# Patient Record
Sex: Male | Born: 1940 | Race: White | Hispanic: No | Marital: Married | Smoking: Never smoker
Health system: Southern US, Community
[De-identification: ages and names within clinical notes are randomized; demographics above are authoritative.]

## PROBLEM LIST (undated history)

## (undated) DIAGNOSIS — N401 Enlarged prostate with lower urinary tract symptoms: Secondary | ICD-10-CM

## (undated) DIAGNOSIS — E119 Type 2 diabetes mellitus without complications: Secondary | ICD-10-CM

## (undated) DIAGNOSIS — D696 Thrombocytopenia, unspecified: Secondary | ICD-10-CM

## (undated) DIAGNOSIS — N400 Enlarged prostate without lower urinary tract symptoms: Secondary | ICD-10-CM

## (undated) DIAGNOSIS — Z8619 Personal history of other infectious and parasitic diseases: Secondary | ICD-10-CM

## (undated) DIAGNOSIS — N3281 Overactive bladder: Secondary | ICD-10-CM

## (undated) DIAGNOSIS — E538 Deficiency of other specified B group vitamins: Secondary | ICD-10-CM

## (undated) DIAGNOSIS — I10 Essential (primary) hypertension: Secondary | ICD-10-CM

## (undated) DIAGNOSIS — M1990 Unspecified osteoarthritis, unspecified site: Secondary | ICD-10-CM

## (undated) DIAGNOSIS — N471 Phimosis: Secondary | ICD-10-CM

## (undated) DIAGNOSIS — F419 Anxiety disorder, unspecified: Secondary | ICD-10-CM

## (undated) DIAGNOSIS — E785 Hyperlipidemia, unspecified: Secondary | ICD-10-CM

## (undated) HISTORY — PX: COLONOSCOPY: SHX174

## (undated) HISTORY — PX: TONSILLECTOMY: SUR1361

## (undated) HISTORY — PX: APPENDECTOMY: SHX54

---

## 1999-07-26 ENCOUNTER — Ambulatory Visit (HOSPITAL_COMMUNITY): Admission: RE | Admit: 1999-07-26 | Discharge: 1999-07-26 | Payer: Self-pay | Admitting: *Deleted

## 1999-07-26 ENCOUNTER — Encounter (INDEPENDENT_AMBULATORY_CARE_PROVIDER_SITE_OTHER): Payer: Self-pay | Admitting: Specialist

## 2000-01-10 ENCOUNTER — Encounter: Payer: Self-pay | Admitting: Cardiology

## 2000-01-10 ENCOUNTER — Ambulatory Visit (HOSPITAL_COMMUNITY): Admission: RE | Admit: 2000-01-10 | Discharge: 2000-01-10 | Payer: Self-pay | Admitting: Cardiology

## 2004-02-26 ENCOUNTER — Ambulatory Visit (HOSPITAL_COMMUNITY): Admission: RE | Admit: 2004-02-26 | Discharge: 2004-02-26 | Payer: Self-pay | Admitting: *Deleted

## 2004-02-26 ENCOUNTER — Encounter (INDEPENDENT_AMBULATORY_CARE_PROVIDER_SITE_OTHER): Payer: Self-pay | Admitting: Specialist

## 2007-07-12 ENCOUNTER — Ambulatory Visit (HOSPITAL_COMMUNITY): Admission: RE | Admit: 2007-07-12 | Discharge: 2007-07-12 | Payer: Self-pay | Admitting: *Deleted

## 2008-02-14 HISTORY — PX: COLONOSCOPY: SHX174

## 2009-04-08 ENCOUNTER — Encounter: Admission: RE | Admit: 2009-04-08 | Discharge: 2009-07-07 | Payer: Self-pay | Admitting: Internal Medicine

## 2010-06-28 NOTE — Op Note (Signed)
NAME:  Kyle Molina, Kyle Molina NO.:  [id number]   MEDICAL RECORD NO.:  [id number]          PATIENT TYPE:  AMB   LOCATION:  ENDO                         FACILITY:  [facility]   PHYSICIAN:  Georgiana Spinner, M.D.    DATE OF BIRTH:  [birth year]   DATE OF PROCEDURE:  07/12/2007  DATE OF DISCHARGE:                               OPERATIVE REPORT   PROCEDURE:  Colonoscopy.   INDICATIONS:  Colon polyps.   ANESTHESIA:  Fentanyl 75 mcg, Versed 7.5 mg, Phenergan 12.5 mg.   PROCEDURE IN DETAIL:  With the patient mildly sedated in the lateral  decubitus position a rectal examination was performed which was  unremarkable.  Subsequently the Pentax videoscopic colonoscope was  inserted in the rectum and passed under direct vision to the cecum  identified by ileocecal valve and appendiceal orifice both of which were  photographed.  From this point the colonoscope was slowly withdrawn  taking circumferential views of colonic mucosa stopping in the rectum  which appeared normal on direct, showed hemorrhoids on retroflexed view.  The endoscope was straightened and withdrawn.  The patient's vital  signs, pulse oximeter remained stable.  The patient tolerated the  procedure well without apparent complications.   FINDINGS:  Diverticulosis of the sigmoid colon, internal hemorrhoids,  otherwise an unremarkable examination.   PLAN:  Repeat examination in 5 years.           ______________________________  Georgiana Spinner, M.D.     GMO/MEDQ  D:  07/12/2007  T:  07/12/2007  Job:  [id number]

## 2010-07-01 NOTE — Procedures (Signed)
[facility]  Patient:    Kyle Molina, Kyle Molina                     MRN: [id number] Proc. Date: 07/26/99 Adm. Date:  [id number] Disc. Date: [id number] Attending:  Sabino Gasser                           Procedure Report  PROCEDURE:  Colonoscopy.  INDICATIONS:  Hemoccult positivity.  ANESTHESIA:  Demerol 80 mg, Versed 10 mg, droperidol 1.25 mg were given intravenously in divided dose.  DESCRIPTION OF PROCEDURE:  With patient mildly sedated in the left lateral decubitus position, the Olympus videoscopic colonoscope was inserted in the rectum after a normal rectal exam and passed under direct vision to the cecum. Cecum identified by the ileocecal valve and appendiceal orifice, both of which were photographed.  We entered into the small bowel through the ileocecal valve.  This too appeared normal and was photographed.  From this point the colonoscope was slowly withdrawn, taking circumferential views of the entire colonic mucosa, stopping at 50 cm from the anal verge, at which point a small polyp was seen, photographed and biopsied and removed using hot biopsy forceps technique, set at 20/20 blended current.  The endoscope was then further withdrawn, taking circumferential views of the remaining colonic mucosa, stopping to photograph a diverticulum seen along the way in the sigmoid colon. The rectum was viewed in retroflexed view.  Internal hemorrhoids were seen. The scope was straightened and withdrawn.  The patients vital signs and pulse oximetry remained stable.  The patient tolerated the procedure well without apparent complications.  FINDINGS:  Small polyp at 50 cm from the anal verge, await biopsy report.  The patient will call me for results.  Rare diverticulum in the sigmoid colon, otherwise unremarkable colonoscopic examination, including normal terminal ileum.  PLAN:  The patient will call me for results of biopsy and follow up with me as an  outpatient. DD:  07/26/99 TD:  07/29/99 Job: [id number]

## 2016-02-14 DIAGNOSIS — Z862 Personal history of diseases of the blood and blood-forming organs and certain disorders involving the immune mechanism: Secondary | ICD-10-CM

## 2016-02-14 HISTORY — DX: Personal history of diseases of the blood and blood-forming organs and certain disorders involving the immune mechanism: Z86.2

## 2016-10-14 DIAGNOSIS — Z872 Personal history of diseases of the skin and subcutaneous tissue: Secondary | ICD-10-CM

## 2016-10-14 HISTORY — DX: Personal history of diseases of the skin and subcutaneous tissue: Z87.2

## 2016-10-24 ENCOUNTER — Emergency Department (HOSPITAL_COMMUNITY): Payer: Medicare HMO

## 2016-10-24 ENCOUNTER — Inpatient Hospital Stay (HOSPITAL_COMMUNITY)
Admission: EM | Admit: 2016-10-24 | Discharge: 2016-10-28 | DRG: 872 | Disposition: A | Discharge status: Home / Self Care | Payer: Medicare HMO | Attending: Internal Medicine | Admitting: Internal Medicine

## 2016-10-24 ENCOUNTER — Encounter (HOSPITAL_COMMUNITY): Payer: Self-pay | Admitting: *Deleted

## 2016-10-24 DIAGNOSIS — L03114 Cellulitis of left upper limb: Secondary | ICD-10-CM | POA: Diagnosis present

## 2016-10-24 DIAGNOSIS — D696 Thrombocytopenia, unspecified: Secondary | ICD-10-CM | POA: Diagnosis present

## 2016-10-24 DIAGNOSIS — Z79899 Other long term (current) drug therapy: Secondary | ICD-10-CM

## 2016-10-24 DIAGNOSIS — A419 Sepsis, unspecified organism: Secondary | ICD-10-CM | POA: Diagnosis not present

## 2016-10-24 DIAGNOSIS — Z7982 Long term (current) use of aspirin: Secondary | ICD-10-CM | POA: Diagnosis not present

## 2016-10-24 DIAGNOSIS — D72819 Decreased white blood cell count, unspecified: Secondary | ICD-10-CM | POA: Diagnosis present

## 2016-10-24 DIAGNOSIS — I10 Essential (primary) hypertension: Secondary | ICD-10-CM | POA: Diagnosis present

## 2016-10-24 DIAGNOSIS — E876 Hypokalemia: Secondary | ICD-10-CM | POA: Diagnosis present

## 2016-10-24 DIAGNOSIS — E119 Type 2 diabetes mellitus without complications: Secondary | ICD-10-CM

## 2016-10-24 DIAGNOSIS — D649 Anemia, unspecified: Secondary | ICD-10-CM | POA: Diagnosis present

## 2016-10-24 DIAGNOSIS — Z882 Allergy status to sulfonamides status: Secondary | ICD-10-CM | POA: Diagnosis not present

## 2016-10-24 DIAGNOSIS — W5501XA Bitten by cat, initial encounter: Secondary | ICD-10-CM

## 2016-10-24 DIAGNOSIS — L03119 Cellulitis of unspecified part of limb: Secondary | ICD-10-CM | POA: Diagnosis not present

## 2016-10-24 DIAGNOSIS — E785 Hyperlipidemia, unspecified: Secondary | ICD-10-CM | POA: Diagnosis present

## 2016-10-24 DIAGNOSIS — E872 Acidosis: Secondary | ICD-10-CM | POA: Diagnosis present

## 2016-10-24 DIAGNOSIS — R000 Tachycardia, unspecified: Secondary | ICD-10-CM | POA: Diagnosis present

## 2016-10-24 DIAGNOSIS — L0390 Cellulitis, unspecified: Secondary | ICD-10-CM | POA: Diagnosis present

## 2016-10-24 HISTORY — DX: Hyperlipidemia, unspecified: E78.5

## 2016-10-24 HISTORY — DX: Essential (primary) hypertension: I10

## 2016-10-24 HISTORY — DX: Type 2 diabetes mellitus without complications: E11.9

## 2016-10-24 LAB — CBC WITH DIFFERENTIAL/PLATELET
Basophils Absolute: 0 10*3/uL (ref 0.0–0.1)
Basophils Relative: 0 %
EOS ABS: 0 10*3/uL (ref 0.0–0.7)
Eosinophils Relative: 0 %
HEMATOCRIT: 37.8 % — AB (ref 39.0–52.0)
HEMOGLOBIN: 13.4 g/dL (ref 13.0–17.0)
LYMPHS ABS: 0.6 10*3/uL — AB (ref 0.7–4.0)
LYMPHS PCT: 5 %
MCH: 30.9 pg (ref 26.0–34.0)
MCHC: 35.4 g/dL (ref 30.0–36.0)
MCV: 87.1 fL (ref 78.0–100.0)
MONOS PCT: 12 %
Monocytes Absolute: 1.3 10*3/uL — ABNORMAL HIGH (ref 0.1–1.0)
NEUTROS PCT: 83 %
Neutro Abs: 9.4 10*3/uL — ABNORMAL HIGH (ref 1.7–7.7)
Platelets: 89 10*3/uL — ABNORMAL LOW (ref 150–400)
RBC: 4.34 MIL/uL (ref 4.22–5.81)
RDW: 12.6 % (ref 11.5–15.5)
WBC: 11.3 10*3/uL — ABNORMAL HIGH (ref 4.0–10.5)

## 2016-10-24 LAB — COMPREHENSIVE METABOLIC PANEL
ALT: 19 U/L (ref 17–63)
AST: 28 U/L (ref 15–41)
Albumin: 3.8 g/dL (ref 3.5–5.0)
Alkaline Phosphatase: 52 U/L (ref 38–126)
Anion gap: 13 (ref 5–15)
BUN: 12 mg/dL (ref 6–20)
CHLORIDE: 103 mmol/L (ref 101–111)
CO2: 22 mmol/L (ref 22–32)
CREATININE: 1.17 mg/dL (ref 0.61–1.24)
Calcium: 8.9 mg/dL (ref 8.9–10.3)
GFR calc non Af Amer: 58 mL/min — ABNORMAL LOW (ref >60)
Glucose, Bld: 165 mg/dL — ABNORMAL HIGH (ref 65–99)
POTASSIUM: 3.2 mmol/L — AB (ref 3.5–5.1)
SODIUM: 138 mmol/L (ref 135–145)
Total Bilirubin: 3 mg/dL — ABNORMAL HIGH (ref 0.3–1.2)
Total Protein: 6.3 g/dL — ABNORMAL LOW (ref 6.5–8.1)

## 2016-10-24 LAB — MAGNESIUM: MAGNESIUM: 1.3 mg/dL — AB (ref 1.7–2.4)

## 2016-10-24 LAB — LACTIC ACID, PLASMA: Lactic Acid, Venous: 3.8 mmol/L (ref 0.5–1.9)

## 2016-10-24 LAB — I-STAT CG4 LACTIC ACID, ED: LACTIC ACID, VENOUS: 2.85 mmol/L — AB (ref 0.5–1.9)

## 2016-10-24 MED ORDER — SODIUM CHLORIDE 0.9 % IV SOLN
3.0000 g | Freq: Once | INTRAVENOUS | Status: AC
  Administered 2016-10-24: 3 g via INTRAVENOUS
  Filled 2016-10-24: qty 3

## 2016-10-24 MED ORDER — ACETAMINOPHEN 325 MG PO TABS
650.0000 mg | ORAL_TABLET | Freq: Four times a day (QID) | ORAL | Status: DC | PRN
  Administered 2016-10-25 – 2016-10-26 (×2): 650 mg via ORAL
  Filled 2016-10-24 (×3): qty 2

## 2016-10-24 MED ORDER — SODIUM CHLORIDE 0.9 % IV BOLUS (SEPSIS)
1000.0000 mL | Freq: Once | INTRAVENOUS | Status: AC
  Administered 2016-10-24: 1000 mL via INTRAVENOUS

## 2016-10-24 MED ORDER — ASPIRIN 81 MG PO CHEW
81.0000 mg | CHEWABLE_TABLET | Freq: Every day | ORAL | Status: DC
  Administered 2016-10-25 – 2016-10-28 (×4): 81 mg via ORAL
  Filled 2016-10-24 (×4): qty 1

## 2016-10-24 MED ORDER — IBUPROFEN 400 MG PO TABS
600.0000 mg | ORAL_TABLET | Freq: Once | ORAL | Status: AC
  Administered 2016-10-24: 600 mg via ORAL
  Filled 2016-10-24: qty 2

## 2016-10-24 MED ORDER — ACETAMINOPHEN 650 MG RE SUPP: 650.0000 mg | Freq: Four times a day (QID) | RECTAL | Status: DC | PRN

## 2016-10-24 MED ORDER — DOXAZOSIN MESYLATE 2 MG PO TABS
4.0000 mg | ORAL_TABLET | Freq: Every evening | ORAL | Status: DC
  Administered 2016-10-24 – 2016-10-27 (×4): 4 mg via ORAL
  Filled 2016-10-24 (×4): qty 2

## 2016-10-24 MED ORDER — NIACIN 500 MG PO TABS
2000.0000 mg | ORAL_TABLET | Freq: Every day | ORAL | Status: DC
Start: 2016-10-24 — End: 2016-10-28
  Administered 2016-10-24 – 2016-10-27 (×3): 2000 mg via ORAL
  Filled 2016-10-24 (×6): qty 4

## 2016-10-24 MED ORDER — ATORVASTATIN CALCIUM 20 MG PO TABS
20.0000 mg | ORAL_TABLET | Freq: Every day | ORAL | Status: DC
  Administered 2016-10-24 – 2016-10-27 (×4): 20 mg via ORAL
  Filled 2016-10-24 (×4): qty 1

## 2016-10-24 MED ORDER — SODIUM CHLORIDE 0.9 % IV SOLN
INTRAVENOUS | Status: DC
  Administered 2016-10-24: via INTRAVENOUS

## 2016-10-24 MED ORDER — SODIUM CHLORIDE 0.9 % IV SOLN
3.0000 g | Freq: Three times a day (TID) | INTRAVENOUS | Status: DC
  Administered 2016-10-25 – 2016-10-26 (×5): 3 g via INTRAVENOUS
  Filled 2016-10-24 (×8): qty 3

## 2016-10-24 MED ORDER — METOPROLOL SUCCINATE ER 50 MG PO TB24
50.0000 mg | ORAL_TABLET | Freq: Two times a day (BID) | ORAL | Status: DC
  Administered 2016-10-24 – 2016-10-28 (×7): 50 mg via ORAL
  Filled 2016-10-24 (×8): qty 1

## 2016-10-24 MED ORDER — POTASSIUM CHLORIDE CRYS ER 20 MEQ PO TBCR
20.0000 meq | EXTENDED_RELEASE_TABLET | Freq: Two times a day (BID) | ORAL | Status: AC
  Administered 2016-10-24 – 2016-10-25 (×2): 20 meq via ORAL
  Filled 2016-10-24 (×2): qty 1

## 2016-10-24 MED ORDER — TRAMADOL HCL 50 MG PO TABS
50.0000 mg | ORAL_TABLET | Freq: Two times a day (BID) | ORAL | Status: DC
  Administered 2016-10-24 – 2016-10-28 (×8): 50 mg via ORAL
  Filled 2016-10-24 (×8): qty 1

## 2016-10-24 MED ORDER — DIAZEPAM 5 MG PO TABS
2.5000 mg | ORAL_TABLET | Freq: Two times a day (BID) | ORAL | Status: DC
  Administered 2016-10-24 – 2016-10-28 (×7): 2.5 mg via ORAL
  Filled 2016-10-24 (×8): qty 1

## 2016-10-24 NOTE — Progress Notes (Signed)
CRITICAL VALUE ALERT  Critical Value:  Lactic acid 3.8  Date & Time Notied:  10/24/16 2300  Provider Notified: Onalee Huaavid, MD  Orders Received/Actions taken: previously addressed

## 2016-10-24 NOTE — H&P (Signed)
History and Physical    Kyle Molina [id number] DOB: April 05, 1940 DOA: 10/24/2016  PCP: Richmond CampbellKaplan, Kristen W., PA-C  Patient coming from:  home  Chief Complaint:   Redness to hand  HPI: Kyle Molina is a 76 y.o. male with medical history significant of HTN, DM, HLD comes in with redness streaking up his left hand after being bitten by his cat 3 days ago.  He was bitten on his left thumb, the area is now red and slightly painful.  He denies any pain with moving his wrist or digits, the pain and discomfort is all superficial.  Redness started streaking up his arm so he came to the ED.  He has not been on oral abx.  There is no drainage anywhere.  Overall pretty healthy.  Pt referred for admission for cellulitis of his hand.  Dr Romeo Appleharrison has been called and [facility]  In route with EMS his temp was 101.   Review of Systems: As per HPI otherwise 10 point review of systems negative.   Past Medical History:  Diagnosis Date  . Diabetes mellitus without complication (HCC)   . Hyperlipidemia   . Hypertension     History reviewed. No pertinent surgical history.   reports that he has never smoked. He has never used smokeless tobacco. He reports that he does not drink alcohol or use drugs.  Allergies  Allergen Reactions  . Sulfa Antibiotics Itching    History reviewed. No pertinent family history.  No premature CAD  Prior to Admission medications   Medication Sig Start Date End Date Taking? Authorizing Provider  aspirin 81 MG tablet Take 81 mg by mouth daily.   Yes [provider]  atorvastatin (LIPITOR) 20 MG tablet Take 20 mg by mouth at bedtime.   Yes [provider]  diazepam (VALIUM) 5 MG tablet Take 2.5 mg by mouth 2 (two) times daily.   Yes [provider]  doxazosin (CARDURA) 4 MG tablet Take 4 mg by mouth every evening.   Yes [provider]  metFORMIN (GLUCOPHAGE) 500 MG tablet Take 1,000 mg by mouth daily  with supper.   Yes [provider]  metoprolol succinate (TOPROL-XL) 100 MG 24 hr tablet Take 50 mg by mouth 2 (two) times daily. Take with or immediately following a meal.   Yes [provider]  niacin 500 MG tablet Take 2,000 mg by mouth at bedtime.   Yes [provider]  traMADol (ULTRAM) 50 MG tablet Take 50 mg by mouth 2 (two) times daily.   Yes [provider]    Physical Exam: Vitals:   10/24/16 1721 10/24/16 1730 10/24/16 1800 10/24/16 2000  BP: 118/60 (!) 122/55 (!) 109/46 (!) 118/51  Pulse: 97 98  94  Resp: 13 10 10  (!) 9  Temp: 99.8 F (37.7 C)     TempSrc: Oral     SpO2: 94% 98% 98% 98%      Constitutional: NAD, calm, comfortable Vitals:   10/24/16 1721 10/24/16 1730 10/24/16 1800 10/24/16 2000  BP: 118/60 (!) 122/55 (!) 109/46 (!) 118/51  Pulse: 97 98  94  Resp: 13 10 10  (!) 9  Temp: 99.8 F (37.7 C)     TempSrc: Oral     SpO2: 94% 98% 98% 98%   Eyes: PERRL, lids and conjunctivae normal ENMT: Mucous membranes are moist. Posterior pharynx clear of any exudate or lesions.Normal dentition.  Neck: normal, supple, no masses, no thyromegaly Respiratory:  clear to auscultation bilaterally, no wheezing, no crackles. Normal respiratory effort. No accessory muscle use.  Cardiovascular: Regular rate and rhythm, no murmurs / rubs / gallops. No extremity edema. 2+ pedal pulses. No carotid bruits.  Abdomen: no tenderness, no masses palpated. No hepatosplenomegaly. Bowel sounds positive.  Musculoskeletal: no clubbing / cyanosis. No joint deformity upper and lower extremities. Good ROM, no contractures. Normal muscle tone.  Skin: no rashes, lesions, ulcers. No induration. X left thumb with erythema with streak up to elbow no swelling, no evidence of deeper infection.  Has full range of motion of left wrist and all digits without any pain Neurologic: CN 2-12 grossly intact. Sensation intact, DTR normal. Strength 5/5 in all 4.  Psychiatric: Normal  judgment and insight. Alert and oriented x 3. Normal mood.    Labs on Admission: I have personally reviewed following labs and imaging studies  CBC:  Recent Labs Lab 10/24/16 1733  WBC 11.3*  NEUTROABS 9.4*  HGB 13.4  HCT 37.8*  MCV 87.1  PLT 89*   Basic Metabolic Panel:  Recent Labs Lab 10/24/16 1733  NA 138  K 3.2*  CL 103  CO2 22  GLUCOSE 165*  BUN 12  CREATININE 1.17  CALCIUM 8.9   GFR: CrCl cannot be calculated (Unknown ideal weight.). Liver Function Tests:  Recent Labs Lab 10/24/16 1733  AST 28  ALT 19  ALKPHOS 52  BILITOT 3.0*  PROT 6.3*  ALBUMIN 3.8    Radiological Exams on Admission: Dg Hand Complete Left  Result Date: 10/24/2016 CLINICAL DATA:  Initial evaluation for acute swelling with redness about the thumb and index finger. EXAM: LEFT HAND - COMPLETE 3+ VIEW COMPARISON:  None available. FINDINGS: No acute fracture dislocation. Diffuse soft tissue swelling present about the thumb and index finger. No radiographic findings to suggest erosive or inflammatory arthropathy. 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 Osteopenia. IMPRESSION: 1. No acute fracture or dislocation. 2. Mild diffuse soft tissue swelling about the thumb and index finger. 3. 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 No radiographic evidence for erosive or inflammatory arthropathy. Electronically Signed   By: Rise Mu M.D.   On: 10/24/2016 18:47    Assessment/Plan 76 yo male with left hand cellulitis from cat bite  Principal Problem:   Cellulitis- mild infection.  Should improve in the next day or so with iv unasyn.  dont think there is deeper infection per my exam, however dr Romeo Apple will see patient in the am for formal hand surgery evaluation.  Lactic 2.8, no  fluids given yet in ED, will bolus one liter now and place on fluids overnight and serial lactate until normalizes.  Pt not septic.  Active Problems:   Hypertension- noted, cont home meds   Hyperlipidemia- cont home statin   Diabetes mellitus without complication (HCC)- holding metformin while in house esp in the setting of mild lactic acidosis    DVT prophylaxis: scds, ambulate  Code Status:  full Family Communication:  none Disposition Plan:  Per day team Consults called:  Dr Romeo Apple Admission status:  admission   DAVID,RACHAL A MD [facility]  If 7PM-7AM, please contact night-coverage [email or web address] Password [facility]  10/24/2016, 9:13 PM

## 2016-10-24 NOTE — Progress Notes (Signed)
Pharmacy Antibiotic Note  Kyle Molina is a 76 y.o. male admitted on 10/24/2016 with cellulitis.  Pharmacy has been consulted for Unasyn dosing.  Initial dose given in ED.  Plan: Unasyn 3gm IV every 8 hours. Monitor labs, micro and vitals.   Height: 5\' 7"  (170.2 cm) Weight: 179 lb 10.8 oz (81.5 kg) IBW/kg (Calculated) : 66.1  Temp (24hrs), Avg:99.2 F (37.3 C), Min:98.5 F (36.9 C), Max:99.8 F (37.7 C)   Recent Labs Lab 10/24/16 1733 10/24/16 1746  WBC 11.3*  --   CREATININE 1.17  --   LATICACIDVEN  --  2.85*    Estimated Creatinine Clearance: 54.1 mL/min (by C-G formula based on SCr of 1.17 mg/dL).    Allergies  Allergen Reactions  . Sulfa Antibiotics Itching    Antimicrobials this admission: Unasyn 9/11 >>   Dose adjustments this admission: n/a   Microbiology results: 9/11 BCx: pending  UCx:    Sputum:    MRSA PCR:   Thank you for allowing pharmacy to be a part of this patient's care.  Mady GemmaHayes, Alazne Quant R 10/24/2016 9:30 PM

## 2016-10-24 NOTE — ED Notes (Signed)
Pt attempted use of urinal to obtain urine sample. Pt unable to urinate at this time.

## 2016-10-24 NOTE — ED Triage Notes (Signed)
Pt states he got bit by his cat 3 days ago on his left hand. Starting 2 two days ago he began having weakness and decreased po intake. Pt has redness, swelling, and warmth to this area. Pt has 101.5 fever in route, given 1G tylenol by EMS.

## 2016-10-24 NOTE — ED Provider Notes (Signed)
Emergency Department Provider Note   I have reviewed the triage vital signs and the nursing notes.   HISTORY  Chief Complaint Fatigue   HPI Kyle Molina is a 76 y.o. male with PMH of DM, HTN, and HLD presents emergency department for evaluation of fevers and chills with associated weakness after sustaining a cat bite 3 days ago to his left hand. The patient was bitten by his own cat who does not leave the house. States he got a vaccine a long time ago but he is not sure if it is up to date on vaccinations. He's noticed redness and swelling in the left thumb seems to be getting worse. His morning he began developing fevers and shaking chills. *Feel significant fatigue and called EMS. They report the patient was febrile in route gave 1 g of Tylenol. Patient denies any cough, difficulty breathing, chest pain. No nausea, vomiting, diarrhea. No sick contacts.   Past Medical History:  Diagnosis Date  . Diabetes mellitus without complication (HCC)   . Hyperlipidemia   . Hypertension     Patient Active Problem List   Diagnosis Date Noted  . Cellulitis 10/24/2016    History reviewed. No pertinent surgical history.    Allergies Sulfa antibiotics  No family history on file.  Social History Social History  Substance Use Topics  . Smoking status: Never Smoker  . Smokeless tobacco: Never Used  . Alcohol use No    Review of Systems  Constitutional: Positive fever/chills with generalized weakness.  Eyes: No visual changes. ENT: No sore throat. Cardiovascular: Denies chest pain. Respiratory: Denies shortness of breath. Gastrointestinal: No abdominal pain.  No nausea, no vomiting.  No diarrhea.  No constipation. Genitourinary: Negative for dysuria. Musculoskeletal: Negative for back pain. Skin: Redness and swelling of left thumb.  Neurological: Negative for headaches, focal weakness or numbness.  10-point ROS otherwise  negative.  ____________________________________________   PHYSICAL EXAM:  VITAL SIGNS: ED Triage Vitals  Enc Vitals Group     BP 10/24/16 1721 118/60     Pulse Rate 10/24/16 1721 97     Resp 10/24/16 1721 13     Temp 10/24/16 1721 99.8 F (37.7 C)     Temp Source 10/24/16 1721 Oral     SpO2 10/24/16 1721 94 %     Pain Score 10/24/16 1715 5   Constitutional: Alert and oriented. Well appearing and in no acute distress. Eyes: Conjunctivae are normal. Head: Atraumatic. Nose: No congestion/rhinnorhea. Mouth/Throat: Mucous membranes are moist.  Oropharynx non-erythematous. Neck: No stridor.   Cardiovascular: Normal rate, regular rhythm. Good peripheral circulation. Grossly normal heart sounds.   Respiratory: Normal respiratory effort.  No retractions. Lungs CTAB. Gastrointestinal: Soft and nontender. No distention.  Musculoskeletal: No lower extremity tenderness nor edema. No gross deformities of extremities. Neurologic:  Normal speech and language. No gross focal neurologic deficits are appreciated.  Skin:  Skin is warm, dry and intact. Diffuse erythema and swelling over the left thumb and tracking up the left forearm to the elbow. The area is warm to touch. No purulent drainage form the wound. No fluctuance or induration. Mildly tender to palpation over the thumb and flexor compartment of the forearm but not severe.   ____________________________________________   LABS (all labs ordered are listed, but only abnormal results are displayed)  Labs Reviewed  COMPREHENSIVE METABOLIC PANEL - Abnormal; Notable for the following:       Result Value   Potassium 3.2 (*)    Glucose, Bld 165 (*)  Total Protein 6.3 (*)    Total Bilirubin 3.0 (*)    GFR calc non Af Amer 58 (*)    All other components within normal limits  CBC WITH DIFFERENTIAL/PLATELET - Abnormal; Notable for the following:    WBC 11.3 (*)    HCT 37.8 (*)    Platelets 89 (*)    Neutro Abs 9.4 (*)    Lymphs Abs 0.6  (*)    Monocytes Absolute 1.3 (*)    All other components within normal limits  I-STAT CG4 LACTIC ACID, ED - Abnormal; Notable for the following:    Lactic Acid, Venous 2.85 (*)    All other components within normal limits  CULTURE, BLOOD (ROUTINE X 2)  CULTURE, BLOOD (ROUTINE X 2)  URINE CULTURE  URINALYSIS, ROUTINE W REFLEX MICROSCOPIC   ____________________________________________  EKG   EKG Interpretation  Date/Time:  Tuesday October 24 2016 17:16:31 EDT Ventricular Rate:  99 PR Interval:    QRS Duration: 99 QT Interval:  359 QTC Calculation: 461 R Axis:   48 Text Interpretation:  Sinus rhythm Inferior infarct, age indeterminate Baseline wander in lead(s) V1 No STEMI.  Confirmed by Alona BeneLong, Joshua [phone]([id number]) on 10/24/2016 5:41:16 PM       ____________________________________________  RADIOLOGY  Dg Hand Complete Left  Result Date: 10/24/2016 CLINICAL DATA:  Initial evaluation for acute swelling with redness about the thumb and index finger. EXAM: LEFT HAND - COMPLETE 3+ VIEW COMPARISON:  None available. FINDINGS: No acute fracture dislocation. Diffuse soft tissue swelling present about the thumb and index finger. No radiographic findings to suggest erosive or inflammatory arthropathy. 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 Osteopenia. IMPRESSION: 1. No acute fracture or dislocation. 2. Mild diffuse soft tissue swelling about the thumb and index finger. 3. 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 No radiographic evidence for erosive or inflammatory arthropathy. Electronically Signed   By: Rise MuBenjamin  McClintock M.D.   On: 10/24/2016 18:47    ____________________________________________   PROCEDURES  Procedure(s) performed:    Procedures  None ____________________________________________   INITIAL IMPRESSION / ASSESSMENT AND PLAN / ED COURSE  Pertinent labs & imaging results that were available during my care of the patient were reviewed by me and considered in my medical decision making (see chart for details).  Patient reports to the emergency department with generalized weakness and fever in the setting of being bitten by his cat on the left thumb 3 days ago. The thumb is diffusely erythematous and mildly tender to palpation. There is no purulence draining from the wound. He does have erythema tracking up the forearm. Suspect this is the cause of his fever. Plan for labs, cultures, x-ray, orthopedic consultation, and likely admission for IV antibiotics. Given history plan to start Unasyn to cover for animal bite organisms as likely source of fever.    07:21 PM Spoke with Dr. Romeo AppleHarrison with Orthopedics. He will consult on the patient to comment on need for OR washout or not. Clinically seems more consistent with cellulitis and developing sepsis.   Discussed patient's case with Hospitalist, Dr. Onalee Huaavid to request admission. Patient and family (if present) updated with plan. Care transferred to Hospitalist service.  I reviewed all nursing notes, vitals, pertinent old records, EKGs, labs, imaging (as available).  ____________________________________________  FINAL CLINICAL IMPRESSION(S) / ED  DIAGNOSES  Final diagnoses:  Cellulitis of hand, left  Cat bite, initial encounter  Sepsis, due to unspecified organism [facility])     MEDICATIONS GIVEN DURING THIS VISIT:  Medications  Ampicillin-Sulbactam (UNASYN) 3 g in sodium chloride 0.9 % 100 mL IVPB (3 g Intravenous New Bag/Given 10/24/16 1819)     NEW OUTPATIENT MEDICATIONS STARTED DURING THIS VISIT:  None  Note:  This document was prepared using Dragon voice recognition software and may include unintentional dictation errors.  Alona Bene, MD Emergency  Medicine    Long, Arlyss Repress, MD 10/24/16 [phone]

## 2016-10-25 DIAGNOSIS — L03119 Cellulitis of unspecified part of limb: Secondary | ICD-10-CM

## 2016-10-25 LAB — CBC
HEMATOCRIT: 34.5 % — AB (ref 39.0–52.0)
HEMOGLOBIN: 12.1 g/dL — AB (ref 13.0–17.0)
MCH: 30.8 pg (ref 26.0–34.0)
MCHC: 35.1 g/dL (ref 30.0–36.0)
MCV: 87.8 fL (ref 78.0–100.0)
Platelets: 90 10*3/uL — ABNORMAL LOW (ref 150–400)
RBC: 3.93 MIL/uL — AB (ref 4.22–5.81)
RDW: 12.9 % (ref 11.5–15.5)
WBC: 10.4 10*3/uL (ref 4.0–10.5)

## 2016-10-25 LAB — BASIC METABOLIC PANEL
Anion gap: 6 (ref 5–15)
BUN: 13 mg/dL (ref 6–20)
CHLORIDE: 108 mmol/L (ref 101–111)
CO2: 22 mmol/L (ref 22–32)
CREATININE: 1.05 mg/dL (ref 0.61–1.24)
Calcium: 8.1 mg/dL — ABNORMAL LOW (ref 8.9–10.3)
GFR calc Af Amer: >60 mL/min (ref >60)
GFR calc non Af Amer: >60 mL/min (ref >60)
GLUCOSE: 154 mg/dL — AB (ref 65–99)
Potassium: 3.6 mmol/L (ref 3.5–5.1)
Sodium: 136 mmol/L (ref 135–145)

## 2016-10-25 LAB — LACTIC ACID, PLASMA
LACTIC ACID, VENOUS: 2.6 mmol/L — AB (ref 0.5–1.9)
Lactic Acid, Venous: 1.8 mmol/L (ref 0.5–1.9)

## 2016-10-25 LAB — MAGNESIUM: Magnesium: 1.6 mg/dL — ABNORMAL LOW (ref 1.7–2.4)

## 2016-10-25 NOTE — Plan of Care (Signed)
Problem: Activity: Goal: Risk for activity intolerance will decrease Outcome: Adequate for Discharge Pt up ad lib to bathroom with stand by assist.  Pt has no c/o weakness and moderate fall risk. No c/o pain. Pt alert and oriented and able to verbalize needs. Will continue to monitor pt

## 2016-10-25 NOTE — Progress Notes (Signed)
CRITICAL VALUE ALERT  Critical Value:  Lactic acid 2.6  Date & Time Notied:  10/25/16 0130  Provider Notified: Onalee Huaavid, MD  Orders Received/Actions taken: maintain fluids

## 2016-10-25 NOTE — Progress Notes (Signed)
Patient ID: Kyle Molina, male   DOB: 1940-03-31, 76 y.o.   MRN: [id number]    PROGRESS NOTE   Kyle RyderFranklin Pharr  [id number] DOB: 1940-03-31 DOA: 10/24/2016  PCP: Richmond CampbellKaplan, Kristen W., PA-C   Brief Narrative:  Patient is 76 year old male with known hypertension, hyperlipidemia, diabetes, presented to emergency department with progressively worsening left upper extremity swelling after being bitten by his cat 3 days prior to this admission. This was associated with tenderness to touch, warm to touch as well as fevers and chills.   Assessment & Plan:   Principal Problem:   Cellulitis of the left upper extremity, cat bite - Based on outlined marking it appears that cellulitis is improving - Patient has been started on Unasyn and will continue same regimen for now - If continued improvement noted in next 24 hours, planned changing to Augmentin and discharging home  Active Problems:   Hypomagnesemia, hypokalemia  - Blood work from this morning pending, supplement K and Mg is still low - repeat BMP and Mg level in AM    Hypertension - Blood pressure somewhat low but overall stable - Continue to monitor  - keep on IV fluids      Diabetes mellitus without complication (HCC) - Keep on sliding scale insulin for now    Thrombocytopenia - unclear etiology - will repeat CBC in AM   DVT prophylaxis:  Code Status: Full  Family Communication: Patient and family at bedside  Disposition Plan: home in am   Consultants:   None  Procedures:   None  Antimicrobials:   Unasyn 9/11 -->  Subjective: Patient reports feeling better.  Objective: Vitals:   10/24/16 2119 10/24/16 2125 10/25/16 0539 10/25/16 1325  BP: (!) 110/58  118/74 (!) 102/48  Pulse: 92  88 84  Resp: 16  16 16   Temp: 98.5 F (36.9 C)  98.2 F (36.8 C)   TempSrc: Oral  Oral   SpO2: 97%  98% 97%  Weight:  81.5 kg (179 lb 10.8 oz)    Height:  5\' 7"  (1.702 m)      Intake/Output Summary (Last 24 hours) at  10/25/16 1539 Last data filed at 10/25/16 0900  Gross per 24 hour  Intake             1860 ml  Output              120 ml  Net             1740 ml   Filed Weights   10/24/16 2125  Weight: 81.5 kg (179 lb 10.8 oz)    Examination:  General exam: Appears calm and comfortable  Respiratory system: Clear to auscultation. Respiratory effort normal. Cardiovascular system: S1 & S2 heard, RRR. No  rubs, gallops or clicks. No pedal edema.  Gastrointestinal system: Abdomen is nondistended, soft and nontender. No organomegaly or masses felt. Normal bowel sounds heard. Central nervous system: Alert and oriented. No focal neurological deficits. Extremities: Symmetric 5 x 5 power.Left upper extremity still mildly swollen but less erythema, minimal TTP Skin: No rashes, lesions or ulcer Psychiatry: Judgement and insight appear normal. Mood & affect appropr   Data Reviewed: I have personally reviewed following labs and imaging studies  CBC:  Recent Labs Lab 10/24/16 1733  WBC 11.3*  NEUTROABS 9.4*  HGB 13.4  HCT 37.8*  MCV 87.1  PLT 89*   Basic Metabolic Panel:  Recent Labs Lab 10/24/16 1733  NA 138  K 3.2*  CL 103  CO2  22  GLUCOSE 165*  BUN 12  CREATININE 1.17  CALCIUM 8.9  MG 1.3*   Liver Function Tests:  Recent Labs Lab 10/24/16 1733  AST 28  ALT 19  ALKPHOS 52  BILITOT 3.0*  PROT 6.3*  ALBUMIN 3.8    Recent Results (from the past 240 hour(s))  Blood Culture (routine x 2)     Status: None (Preliminary result)   Collection Time: 10/24/16  5:31 PM  Result Value Ref Range Status   Specimen Description BLOOD  Final   Special Requests Normal  Final   Culture NO GROWTH < 24 HOURS  Final   Report Status PENDING  Incomplete  Blood Culture (routine x 2)     Status: None (Preliminary result)   Collection Time: 10/24/16  5:33 PM  Result Value Ref Range Status   Specimen Description BLOOD LEFT FOREARM  Final   Special Requests   Final    BOTTLES DRAWN AEROBIC AND  ANAEROBIC Blood Culture adequate volume   Culture NO GROWTH < 24 HOURS  Final   Report Status PENDING  Incomplete      Radiology Studies: Dg Hand Complete Left  Result Date: 10/24/2016 CLINICAL DATA:  Initial evaluation for acute swelling with redness about the thumb and index finger. EXAM: LEFT HAND - COMPLETE 3+ VIEW COMPARISON:  None available. FINDINGS: No acute fracture dislocation. Diffuse soft tissue swelling present about the thumb and index finger. No radiographic findings to suggest erosive or inflammatory arthropathy. 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 Osteopenia. IMPRESSION: 1. No acute fracture or dislocation. 2. Mild diffuse soft tissue swelling about the thumb and index finger. 3. 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 No radiographic evidence for erosive or inflammatory arthropathy. Electronically Signed   By: Rise Mu M.D.   On: 10/24/2016 18:47     Scheduled Meds: . aspirin  81 mg Oral Daily  . atorvastatin  20 mg Oral QHS  . diazepam  2.5 mg Oral BID  . doxazosin  4 mg Oral QPM  . metoprolol succinate  50 mg Oral BID  . niacin  2,000 mg Oral QHS  . traMADol  50 mg Oral BID   Continuous Infusions: . ampicillin-sulbactam (UNASYN) IV Stopped (10/25/16 0957)     LOS: 1 day    Time spent: 25 minutes    Debbora Presto, MD [facility] Pager [phone]  If 7PM-7AM, please contact night-coverage [email or web address] Password [facility] 10/25/2016, 3:39 PM

## 2016-10-26 LAB — CBC WITH DIFFERENTIAL/PLATELET
Basophils Absolute: 0 10*3/uL (ref 0.0–0.1)
Basophils Relative: 0 %
Eosinophils Absolute: 0 10*3/uL (ref 0.0–0.7)
Eosinophils Relative: 0 %
HCT: 33.1 % — ABNORMAL LOW (ref 39.0–52.0)
Hemoglobin: 11.7 g/dL — ABNORMAL LOW (ref 13.0–17.0)
Lymphocytes Relative: 4 %
Lymphs Abs: 0.3 10*3/uL — ABNORMAL LOW (ref 0.7–4.0)
MCH: 30.7 pg (ref 26.0–34.0)
MCHC: 35.3 g/dL (ref 30.0–36.0)
MCV: 86.9 fL (ref 78.0–100.0)
Monocytes Absolute: 0.3 10*3/uL (ref 0.1–1.0)
Monocytes Relative: 5 %
Neutro Abs: 6.5 10*3/uL (ref 1.7–7.7)
Neutrophils Relative %: 91 %
Platelets: 89 10*3/uL — ABNORMAL LOW (ref 150–400)
RBC: 3.81 MIL/uL — ABNORMAL LOW (ref 4.22–5.81)
RDW: 12.6 % (ref 11.5–15.5)
WBC: 7.1 10*3/uL (ref 4.0–10.5)

## 2016-10-26 LAB — LACTIC ACID, PLASMA
LACTIC ACID, VENOUS: 1.5 mmol/L (ref 0.5–1.9)
Lactic Acid, Venous: 1.3 mmol/L (ref 0.5–1.9)

## 2016-10-26 LAB — COMPREHENSIVE METABOLIC PANEL WITH GFR
ALT: 18 U/L (ref 17–63)
AST: 34 U/L (ref 15–41)
Albumin: 2.9 g/dL — ABNORMAL LOW (ref 3.5–5.0)
Alkaline Phosphatase: 42 U/L (ref 38–126)
Anion gap: 8 (ref 5–15)
BUN: 12 mg/dL (ref 6–20)
CO2: 21 mmol/L — ABNORMAL LOW (ref 22–32)
Calcium: 8 mg/dL — ABNORMAL LOW (ref 8.9–10.3)
Chloride: 106 mmol/L (ref 101–111)
Creatinine, Ser: 0.93 mg/dL (ref 0.61–1.24)
GFR calc Af Amer: >60 mL/min
GFR calc non Af Amer: >60 mL/min
Glucose, Bld: 170 mg/dL — ABNORMAL HIGH (ref 65–99)
Potassium: 3.4 mmol/L — ABNORMAL LOW (ref 3.5–5.1)
Sodium: 135 mmol/L (ref 135–145)
Total Bilirubin: 2.3 mg/dL — ABNORMAL HIGH (ref 0.3–1.2)
Total Protein: 5.7 g/dL — ABNORMAL LOW (ref 6.5–8.1)

## 2016-10-26 LAB — CBC
HEMATOCRIT: 34.4 % — AB (ref 39.0–52.0)
HEMOGLOBIN: 11.8 g/dL — AB (ref 13.0–17.0)
MCH: 30.4 pg (ref 26.0–34.0)
MCHC: 34.3 g/dL (ref 30.0–36.0)
MCV: 88.7 fL (ref 78.0–100.0)
Platelets: 88 10*3/uL — ABNORMAL LOW (ref 150–400)
RBC: 3.88 MIL/uL — ABNORMAL LOW (ref 4.22–5.81)
RDW: 13 % (ref 11.5–15.5)
WBC: 7.8 10*3/uL (ref 4.0–10.5)

## 2016-10-26 LAB — APTT: aPTT: 44 s — ABNORMAL HIGH (ref 24–36)

## 2016-10-26 LAB — PROTIME-INR
INR: 1.08
PROTHROMBIN TIME: 13.9 s (ref 11.4–15.2)

## 2016-10-26 LAB — MAGNESIUM: Magnesium: 1.9 mg/dL (ref 1.7–2.4)

## 2016-10-26 LAB — PROCALCITONIN: PROCALCITONIN: 5.68 ng/mL

## 2016-10-26 MED ORDER — PIPERACILLIN-TAZOBACTAM 3.375 G IVPB 30 MIN
3.3750 g | Freq: Once | INTRAVENOUS | Status: AC
  Administered 2016-10-26: 3.375 g via INTRAVENOUS
  Filled 2016-10-26 (×2): qty 50

## 2016-10-26 MED ORDER — SODIUM CHLORIDE 0.9 % IV SOLN
INTRAVENOUS | Status: DC
  Administered 2016-10-26 – 2016-10-27 (×2): via INTRAVENOUS

## 2016-10-26 MED ORDER — IBUPROFEN 400 MG PO TABS: 400.0000 mg | ORAL_TABLET | ORAL | Status: DC | PRN

## 2016-10-26 MED ORDER — LORAZEPAM 2 MG/ML IJ SOLN: 0.5000 mg | Freq: Three times a day (TID) | INTRAMUSCULAR | Status: DC | PRN

## 2016-10-26 MED ORDER — VANCOMYCIN HCL 10 G IV SOLR
1500.0000 mg | Freq: Once | INTRAVENOUS | Status: AC
  Administered 2016-10-26: 1500 mg via INTRAVENOUS
  Filled 2016-10-26: qty 1500

## 2016-10-26 NOTE — Progress Notes (Signed)
Pharmacy Antibiotic Note  Kyle Molina is a 76 y.o. male admitted on 10/24/2016 with cellulitis.  Pharmacy initially consulted for Unasyn dosing but with worsening cellulitis will change to vanc and zosyn  Plan: Vancomycin 1500 mg IV x 1 then 1250 mg IV q24 hours Zosyn 3.375 gm IV q8 hours F/u renal function, cultures and clinical course   Height: 5\' 7"  (170.2 cm) Weight: 179 lb 10.8 oz (81.5 kg) IBW/kg (Calculated) : 66.1  Temp (24hrs), Avg:99.3 F (37.4 C), Min:98.7 F (37.1 C), Max:100.6 F (38.1 C)   Recent Labs Lab 10/24/16 1733 10/24/16 1746 10/24/16 2204 10/25/16 0100 10/25/16 1546 10/26/16 0441  WBC 11.3*  --   --   --  10.4 7.8  CREATININE 1.17  --   --   --  1.05  --   LATICACIDVEN  --  2.85* 3.8* 2.6* 1.8  --     Estimated Creatinine Clearance: 60.3 mL/min (by C-G formula based on SCr of 1.05 mg/dL).    Allergies  Allergen Reactions  . Sulfa Antibiotics Itching    Antimicrobials this admission: Unasyn 9/11 >> 9/13 Vanc 9/13>> Zosyn 9/13>>  Dose adjustments this admission: n/a   Microbiology results: 9/11 BCx: ngtd  Thank you for allowing pharmacy to be a part of this patient's care.  Woodfin GanjaSeay, Cordie Buening Poteet 10/26/2016 2:39 PM

## 2016-10-26 NOTE — Consult Note (Signed)
Reason for Consult: left hand cat bite  Referring Physician: DR Okey Dupre is an 76 y.o. male.  HPI: 76 yo male sustained cat bite from indoor cat. DOI: 8/sept/2018 ER history: Kyle Molina is a 76 y.o. male with medical history significant of HTN, DM, HLD comes in with redness streaking up his left hand after being bitten by his cat 3 days ago.  He was bitten on his left thumb, the area is now red and slightly painful.  He denies any pain with moving his wrist or digits, the pain and discomfort is all superficial.  Redness started streaking up his arm so he came to the ED.  He has not been on oral abx. There is no drainage anywhere.  Overall pretty healthy.  Pt referred for admission for cellulitis of his hand.   In route with EMS his temp was 101.   Past Medical History:  Diagnosis Date  . Diabetes mellitus without complication ([place])   . Hyperlipidemia   . Hypertension     History reviewed.denies prior surgical history.  Family history: no h/o bleeding disorder or anesthesia issues   Social History:  reports that he has never smoked. He has never used smokeless tobacco. He reports that he does not drink alcohol or use drugs.  Allergies:  Allergies  Allergen Reactions  . Sulfa Antibiotics Itching    Medications:  Current Facility-Administered Medications:  .  0.9 %  sodium chloride infusion, , Intravenous, Continuous, Theodis Blaze, MD .  acetaminophen (TYLENOL) tablet 650 mg, 650 mg, Oral, Q6H PRN, 650 mg at 10/26/16 1440 **OR** acetaminophen (TYLENOL) suppository 650 mg, 650 mg, Rectal, Q6H PRN, Derrill Kay A, MD .  aspirin chewable tablet 81 mg, 81 mg, Oral, Daily, Derrill Kay A, MD, 81 mg at 10/26/16 1152 .  atorvastatin (LIPITOR) tablet 20 mg, 20 mg, Oral, QHS, Derrill Kay A, MD, 20 mg at 10/25/16 2053 .  diazepam (VALIUM) tablet 2.5 mg, 2.5 mg, Oral, BID, Derrill Kay A, MD, 2.5 mg at 10/26/16 1151 .  doxazosin (CARDURA) tablet 4 mg, 4 mg, Oral,  QPM, Derrill Kay A, MD, 4 mg at 10/25/16 1637 .  ibuprofen (ADVIL,MOTRIN) tablet 400 mg, 400 mg, Oral, Q4H PRN, Theodis Blaze, MD .  LORazepam (ATIVAN) injection 0.5 mg, 0.5 mg, Intravenous, Q8H PRN, Theodis Blaze, MD .  metoprolol succinate (TOPROL-XL) 24 hr tablet 50 mg, 50 mg, Oral, BID, Derrill Kay A, MD, 50 mg at 10/26/16 1151 .  niacin tablet 2,000 mg, 2,000 mg, Oral, QHS, Derrill Kay A, MD, 2,000 mg at 10/25/16 2057 .  piperacillin-tazobactam (ZOSYN) IVPB 3.375 g, 3.375 g, Intravenous, Once, Theodis Blaze, MD .  traMADol Veatrice Bourbon) tablet 50 mg, 50 mg, Oral, BID, Derrill Kay A, MD, 50 mg at 10/26/16 1151 .  vancomycin (VANCOCIN) 1,500 mg in sodium chloride 0.9 % 500 mL IVPB, 1,500 mg, Intravenous, Once, Theodis Blaze, MD   Results for orders placed or performed during the hospital encounter of 10/24/16 (from the past 48 hour(s))  Blood Culture (routine x 2)     Status: None (Preliminary result)   Collection Time: 10/24/16  5:31 PM  Result Value Ref Range   Specimen Description BLOOD    Special Requests Normal    Culture NO GROWTH 2 DAYS    Report Status PENDING   Comprehensive metabolic panel     Status: Abnormal   Collection Time: 10/24/16  5:33 PM  Result Value Ref Range  Sodium 138 135 - 145 mmol/L   Potassium 3.2 (L) 3.5 - 5.1 mmol/L   Chloride 103 101 - 111 mmol/L   CO2 22 22 - 32 mmol/L   Glucose, Bld 165 (H) 65 - 99 mg/dL   BUN 12 6 - 20 mg/dL   Creatinine, Ser 1.17 0.61 - 1.24 mg/dL   Calcium 8.9 8.9 - 10.3 mg/dL   Total Protein 6.3 (L) 6.5 - 8.1 g/dL   Albumin 3.8 3.5 - 5.0 g/dL   AST 28 15 - 41 U/L   ALT 19 17 - 63 U/L   Alkaline Phosphatase 52 38 - 126 U/L   Total Bilirubin 3.0 (H) 0.3 - 1.2 mg/dL   GFR calc non Af Amer 58 (L) >60 mL/min   GFR calc Af Amer >60 >60 mL/min    Comment: (NOTE) The eGFR has been calculated using the CKD EPI equation. This calculation has not been validated in all clinical situations. eGFR's persistently <60 mL/min  signify possible Chronic Kidney Disease.    Anion gap 13 5 - 15  CBC WITH DIFFERENTIAL     Status: Abnormal   Collection Time: 10/24/16  5:33 PM  Result Value Ref Range   WBC 11.3 (H) 4.0 - 10.5 K/uL   RBC 4.34 4.22 - 5.81 MIL/uL   Hemoglobin 13.4 13.0 - 17.0 g/dL   HCT 37.8 (L) 39.0 - 52.0 %   MCV 87.1 78.0 - 100.0 fL   MCH 30.9 26.0 - 34.0 pg   MCHC 35.4 30.0 - 36.0 g/dL   RDW 12.6 11.5 - 15.5 %   Platelets 89 (L) 150 - 400 K/uL    Comment: SPECIMEN CHECKED FOR CLOTS   Neutrophils Relative % 83 %   Neutro Abs 9.4 (H) 1.7 - 7.7 K/uL   Lymphocytes Relative 5 %   Lymphs Abs 0.6 (L) 0.7 - 4.0 K/uL   Monocytes Relative 12 %   Monocytes Absolute 1.3 (H) 0.1 - 1.0 K/uL   Eosinophils Relative 0 %   Eosinophils Absolute 0.0 0.0 - 0.7 K/uL   Basophils Relative 0 %   Basophils Absolute 0.0 0.0 - 0.1 K/uL  Blood Culture (routine x 2)     Status: None (Preliminary result)   Collection Time: 10/24/16  5:33 PM  Result Value Ref Range   Specimen Description BLOOD LEFT FOREARM    Special Requests      BOTTLES DRAWN AEROBIC AND ANAEROBIC Blood Culture adequate volume   Culture NO GROWTH 2 DAYS    Report Status PENDING   Magnesium     Status: Abnormal   Collection Time: 10/24/16  5:33 PM  Result Value Ref Range   Magnesium 1.3 (L) 1.7 - 2.4 mg/dL  I-Stat CG4 Lactic Acid, ED  (not at  [facility])     Status: Abnormal   Collection Time: 10/24/16  5:46 PM  Result Value Ref Range   Lactic Acid, Venous 2.85 (HH) 0.5 - 1.9 mmol/L  Lactic acid, plasma     Status: Abnormal   Collection Time: 10/24/16 10:04 PM  Result Value Ref Range   Lactic Acid, Venous 3.8 (HH) 0.5 - 1.9 mmol/L    Comment: CRITICAL RESULT CALLED TO, READ BACK BY AND VERIFIED WITH: TETREAULT,H AT 2258 ON 9.11.2018 BY ISLEY,B   Lactic acid, plasma     Status: Abnormal   Collection Time: 10/25/16  1:00 AM  Result Value Ref Range   Lactic Acid, Venous 2.6 (HH) 0.5 - 1.9 mmol/L    Comment: CRITICAL  RESULT CALLED TO, READ BACK BY  AND VERIFIED WITH:  GAVIN,B @ 0132 ON 10/25/16 BY JUW   CBC     Status: Abnormal   Collection Time: 10/25/16  3:46 PM  Result Value Ref Range   WBC 10.4 4.0 - 10.5 K/uL   RBC 3.93 (L) 4.22 - 5.81 MIL/uL   Hemoglobin 12.1 (L) 13.0 - 17.0 g/dL   HCT 34.5 (L) 39.0 - 52.0 %   MCV 87.8 78.0 - 100.0 fL   MCH 30.8 26.0 - 34.0 pg   MCHC 35.1 30.0 - 36.0 g/dL   RDW 12.9 11.5 - 15.5 %   Platelets 90 (L) 150 - 400 K/uL    Comment: CONSISTENT WITH PREVIOUS RESULT SPECIMEN CHECKED FOR CLOTS   Basic metabolic panel     Status: Abnormal   Collection Time: 10/25/16  3:46 PM  Result Value Ref Range   Sodium 136 135 - 145 mmol/L   Potassium 3.6 3.5 - 5.1 mmol/L   Chloride 108 101 - 111 mmol/L   CO2 22 22 - 32 mmol/L   Glucose, Bld 154 (H) 65 - 99 mg/dL   BUN 13 6 - 20 mg/dL   Creatinine, Ser 1.05 0.61 - 1.24 mg/dL   Calcium 8.1 (L) 8.9 - 10.3 mg/dL   GFR calc non Af Amer >60 >60 mL/min   GFR calc Af Amer >60 >60 mL/min    Comment: (NOTE) The eGFR has been calculated using the CKD EPI equation. This calculation has not been validated in all clinical situations. eGFR's persistently <60 mL/min signify possible Chronic Kidney Disease.    Anion gap 6 5 - 15  Lactic acid, plasma     Status: None   Collection Time: 10/25/16  3:46 PM  Result Value Ref Range   Lactic Acid, Venous 1.8 0.5 - 1.9 mmol/L  Magnesium     Status: Abnormal   Collection Time: 10/25/16  3:46 PM  Result Value Ref Range   Magnesium 1.6 (L) 1.7 - 2.4 mg/dL  CBC     Status: Abnormal   Collection Time: 10/26/16  4:41 AM  Result Value Ref Range   WBC 7.8 4.0 - 10.5 K/uL   RBC 3.88 (L) 4.22 - 5.81 MIL/uL   Hemoglobin 11.8 (L) 13.0 - 17.0 g/dL   HCT 34.4 (L) 39.0 - 52.0 %   MCV 88.7 78.0 - 100.0 fL   MCH 30.4 26.0 - 34.0 pg   MCHC 34.3 30.0 - 36.0 g/dL   RDW 13.0 11.5 - 15.5 %   Platelets 88 (L) 150 - 400 K/uL    Comment: SPECIMEN CHECKED FOR CLOTS CONSISTENT WITH PREVIOUS RESULT   Magnesium     Status: None    Collection Time: 10/26/16  4:41 AM  Result Value Ref Range   Magnesium 1.9 1.7 - 2.4 mg/dL    Dg Hand Complete Left  Result Date: 10/24/2016 CLINICAL DATA:  Initial evaluation for acute swelling with redness about the thumb and index finger. EXAM: LEFT HAND - COMPLETE 3+ VIEW COMPARISON:  None available. FINDINGS: No acute fracture dislocation. Diffuse soft tissue swelling present about the thumb and index finger. No radiographic findings to suggest erosive or inflammatory arthropathy. 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 Osteopenia. IMPRESSION: 1. No acute fracture or dislocation. 2. Mild diffuse soft tissue swelling about the thumb and index finger. 3. Prominent degenerative osteoarthritic changes throughout the DIP  and PIP joints of the hand as well as the left first and second MCP joints. No radiographic evidence for erosive or inflammatory arthropathy. Electronically Signed   By: Jeannine Boga M.D.   On: 10/24/2016 18:47   CBC Latest Ref Rng & Units 10/26/2016 10/25/2016 10/24/2016  WBC 4.0 - 10.5 K/uL 7.8 10.4 11.3(H)  Hemoglobin 13.0 - 17.0 g/dL 11.8(L) 12.1(L) 13.4  Hematocrit 39.0 - 52.0 % 34.4(L) 34.5(L) 37.8(L)  Platelets 150 - 400 K/uL 88(L) 90(L) 89(L)      Review of Systems  Constitutional: Positive for fever and malaise/fatigue.  Neurological: Negative for tingling and focal weakness.   Blood pressure (!) 149/65, pulse 92, temperature (!) 100.6 F (38.1 C), temperature source Oral, resp. rate 18, height _0  (1.702 m), weight 179 lb 10.8 oz (81.5 kg), SpO2 98 %. Physical Exam  Constitutional: He appears well-developed and well-nourished. He appears lethargic. No distress.  HENT:  Head: Normocephalic and atraumatic.  Right Ear: External ear normal.  Left Ear: External ear normal.  Eyes: Conjunctivae and EOM are normal. Right eye exhibits no discharge. Left  eye exhibits no discharge. No scleral icterus.  Neck: No JVD present. No tracheal deviation present.  Cardiovascular: Normal rate and intact distal pulses.   Musculoskeletal:  Left hand: puncture wound ulnar and volar side left thumb The thumb is soft as is the forearm (Dr Doyle Askew has noted increased swelling and erythema) he has tenderness along the area of erythema although the pulp space is soft and non tender  The dip joint is mobile and stable and flexor tendon is normal in strength. The streaking runs up the forearm to just proximal to the elbow  Right upper extremity normal rom strengthy stablity and appearance neuro vascular exam normal   Lymphadenopathy:    He has no cervical adenopathy.    He has no axillary adenopathy.       Right: No supraclavicular and no epitrochlear adenopathy present.       Left: No supraclavicular and no epitrochlear adenopathy present.  Neurological: He appears lethargic. He displays no atrophy. No sensory deficit. He exhibits normal muscle tone. Gait abnormal.  Skin: Skin is warm. There is erythema.  Psychiatric: He has a normal mood and affect. His behavior is normal. Judgment and thought content normal.    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

## 2016-10-26 NOTE — Progress Notes (Signed)
Called to bedside, patient with worsening mental status, fever despite use of IV antibiotics. Patient assessed at bedside, alert and oriented but appears slightly agitated. We'll change antibiotics to bank and Zosyn for now, reinitiate sepsis protocol. Hand surgeon consulted as cellulitis appears to be getting worse. Continue to closely monitor. Follow-up on blood work that were obtained with sepsis protocol including CBC, CMP, blood cultures, pro calcitonin, lactic acid.  Kyle PrestoMAGICK-Rasheed Welty, MD  [facility] Pager [phone]  If 7PM-7AM, please contact night-coverage [email or web address] Password [email or web address]

## 2016-10-26 NOTE — Progress Notes (Signed)
Patient ID: Kyle Molina, male   DOB: 1940-11-06, 76 y.o.   MRN: [id number]    PROGRESS NOTE   Veronica Guerrant  HYQ:[id number] DOB: 09/08/1940 DOA: 10/24/2016  PCP: Richmond Campbell., PA-C   Brief Narrative:  Patient is 76 year old male with known hypertension, hyperlipidemia, diabetes, presented to emergency department with progressively worsening left upper extremity swelling after being bitten by his cat 3 days prior to this admission. This was associated with tenderness to touch, warm to touch as well as fevers and chills.   Assessment & Plan:   Principal Problem:   Cellulitis of the left upper extremity, cat bite - Appears slightly worse this morning despite continuation of Unasyn - Patient denies any fevers this morning and white blood cell count is now within normal limits - I have encouraged patient to keep extremity elevated - We'll keep on Unasyn for now with low threshold to broaden the spectrum if cellulitis gets worse - Blood cultures negative to date  Active Problems:   Hypomagnesemia, hypokalemia  - Both electrolytes supplemented and within normal limits this morning    Hypertension - Overall stable      Diabetes mellitus without complication (HCC) - Keep on sliding scale insulin for now    Thrombocytopenia - Etiology not clear, continue to follow   DVT prophylaxis: Lovenox subcutaneous Code Status: Full  Family Communication: Patient and wife at bedside Disposition Plan: Home once able to take off IV antibiotics  Consultants:   None  Procedures:   None  Antimicrobials:   Unasyn 9/11 -->  Subjective:  Patient reports more pain in left upper extremity.  Objective: Vitals:   10/25/16 0539 10/25/16 1325 10/25/16 2047 10/26/16 0253  BP: 118/74 (!) 102/48 (!) 109/46 126/60  Pulse: 88 84 90 91  Resp: Temp: 98.2 F (36.8 C)  98.7 F (37.1 C) 98.7 F (37.1 C)  TempSrc: Oral  Oral Oral  SpO2: 98% 97% 97% 97%  Weight:        Height:        Intake/Output Summary (Last 24 hours) at 10/26/16 1218 Last data filed at 10/26/16 0351  Gross per 24 hour  Intake              840 ml  Output              500 ml  Net              340 ml   Filed Weights   10/24/16 2125  Weight: 81.5 kg (179 lb 10.8 oz)    Physical Exam  Constitutional: Appears well-developed and well-nourished.  CVS: RRR, S1/S2 +, no murmurs, no gallops, no carotid bruit.  Pulmonary: Effort and breath sounds normal, no stridor, rhonchi, wheezes, rales.  Abdominal: Soft. BS +,  no distension, tenderness, rebound or guarding.  Musculoskeletal: Normal range of motion. Left upper extremity cellulitis slightly worse, more erythema on the medial aspects of the left forearm, remains warm to touch and tender to palpation   Data Reviewed: I have personally reviewed following labs and imaging studies  CBC:  Recent Labs Lab 10/24/16 1733 10/25/16 1546 10/26/16 0441  WBC 11.3* 10.4 7.8  NEUTROABS 9.4*  --   --   HGB 13.4 12.1* 11.8*  HCT 37.8* 34.5* 34.4*  MCV 87.1 87.8 88.7  PLT 89* 90* 88*   Basic Metabolic Panel:  Recent Labs Lab 10/24/16 1733 10/25/16 1546 10/26/16 0441  NA 138 136  --   K  3.2* 3.6  --   CL 103 108  --   CO2 22 22  --   GLUCOSE 165* 154*  --   BUN 12 13  --   CREATININE 1.17 1.05  --   CALCIUM 8.9 8.1*  --   MG 1.3* 1.6* 1.9   Liver Function Tests:  Recent Labs Lab 10/24/16 1733  AST 28  ALT 19  ALKPHOS 52  BILITOT 3.0*  PROT 6.3*  ALBUMIN 3.8    Recent Results (from the past 240 hour(s))  Blood Culture (routine x 2)     Status: None (Preliminary result)   Collection Time: 10/24/16  5:31 PM  Result Value Ref Range Status   Specimen Description BLOOD  Final   Special Requests Normal  Final   Culture NO GROWTH 2 DAYS  Final   Report Status PENDING  Incomplete  Blood Culture (routine x 2)     Status: None (Preliminary result)   Collection Time: 10/24/16  5:33 PM  Result Value Ref Range Status    Specimen Description BLOOD LEFT FOREARM  Final   Special Requests   Final    BOTTLES DRAWN AEROBIC AND ANAEROBIC Blood Culture adequate volume   Culture NO GROWTH 2 DAYS  Final   Report Status PENDING  Incomplete      Radiology Studies: Dg Hand Complete Left  Result Date: 10/24/2016 CLINICAL DATA:  Initial evaluation for acute swelling with redness about the thumb and index finger. EXAM: LEFT HAND - COMPLETE 3+ VIEW COMPARISON:  None available. FINDINGS: No acute fracture dislocation. Diffuse soft tissue swelling present about the thumb and index finger. No radiographic findings to suggest erosive or inflammatory arthropathy. 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 Osteopenia. IMPRESSION: 1. No acute fracture or dislocation. 2. Mild diffuse soft tissue swelling about the thumb and index finger. 3. 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 No radiographic evidence for erosive or inflammatory arthropathy. Electronically Signed   By: Rise MuBenjamin  McClintock M.D.   On: 10/24/2016 18:47     Scheduled Meds: . aspirin  81 mg Oral Daily  . atorvastatin  20 mg Oral QHS  . diazepam  2.5 mg Oral BID  . doxazosin  4 mg Oral QPM  . metoprolol succinate  50 mg Oral BID  . niacin  2,000 mg Oral QHS  . traMADol  50 mg Oral BID   Continuous Infusions: . ampicillin-sulbactam (UNASYN) IV 3 g (10/26/16 1151)     LOS: 2 days    Time spent:  25 minutes    Debbora PrestoIskra Magick-Myers, MD [facility] Pager [phone]  If 7PM-7AM, please contact night-coverage [email or web address] Password [email or web address] 10/26/2016, 12:18 PM

## 2016-10-27 LAB — BASIC METABOLIC PANEL
Anion gap: 7 (ref 5–15)
BUN: 11 mg/dL (ref 6–20)
CALCIUM: 7.8 mg/dL — AB (ref 8.9–10.3)
CO2: 22 mmol/L (ref 22–32)
CREATININE: 0.92 mg/dL (ref 0.61–1.24)
Chloride: 105 mmol/L (ref 101–111)
GFR calc Af Amer: >60 mL/min (ref >60)
GLUCOSE: 136 mg/dL — AB (ref 65–99)
POTASSIUM: 3.2 mmol/L — AB (ref 3.5–5.1)
Sodium: 134 mmol/L — ABNORMAL LOW (ref 135–145)

## 2016-10-27 LAB — CBC
HCT: 32.9 % — ABNORMAL LOW (ref 39.0–52.0)
Hemoglobin: 11.6 g/dL — ABNORMAL LOW (ref 13.0–17.0)
MCH: 30.4 pg (ref 26.0–34.0)
MCHC: 35.3 g/dL (ref 30.0–36.0)
MCV: 86.1 fL (ref 78.0–100.0)
PLATELETS: 85 10*3/uL — AB (ref 150–400)
RBC: 3.82 MIL/uL — AB (ref 4.22–5.81)
RDW: 12.6 % (ref 11.5–15.5)
WBC: 3.9 10*3/uL — AB (ref 4.0–10.5)

## 2016-10-27 LAB — MAGNESIUM: MAGNESIUM: 1.7 mg/dL (ref 1.7–2.4)

## 2016-10-27 MED ORDER — PIPERACILLIN-TAZOBACTAM 3.375 G IVPB
3.3750 g | Freq: Three times a day (TID) | INTRAVENOUS | Status: DC
  Administered 2016-10-27 – 2016-10-28 (×4): 3.375 g via INTRAVENOUS
  Filled 2016-10-27 (×4): qty 50

## 2016-10-27 MED ORDER — MAGNESIUM SULFATE 2 GM/50ML IV SOLN
2.0000 g | Freq: Once | INTRAVENOUS | Status: AC
  Administered 2016-10-27: 2 g via INTRAVENOUS
  Filled 2016-10-27: qty 50

## 2016-10-27 MED ORDER — POTASSIUM CHLORIDE CRYS ER 20 MEQ PO TBCR
40.0000 meq | EXTENDED_RELEASE_TABLET | Freq: Once | ORAL | Status: AC
Start: 2016-10-27 — End: 2016-10-27
  Administered 2016-10-27: 40 meq via ORAL
  Filled 2016-10-27: qty 2

## 2016-10-27 MED ORDER — VANCOMYCIN HCL 10 G IV SOLR
1250.0000 mg | INTRAVENOUS | Status: DC
  Administered 2016-10-27: 1250 mg via INTRAVENOUS
  Filled 2016-10-27 (×3): qty 1250

## 2016-10-27 NOTE — Progress Notes (Addendum)
Patient ID: Kyle Molina, male   DOB: [birth year], 76 y.o.   MRN: [id number]    PROGRESS NOTE   Karson Reede  LGX:[id number] DOB: 25-Jul-1940 DOA: 10/24/2016  PCP: Aletha Halim., PA-C   Brief Narrative:  Patient is 76 year old male with known hypertension, hyperlipidemia, diabetes, presented to emergency department with progressively worsening left upper extremity swelling after being bitten by his cat 3 days prior to this admission. This was associated with tenderness to touch, warm to touch as well as fevers and chills.   Assessment & Plan:   Principal Problem:   Sepsis due to Cellulitis of the left upper extremity, cat bite - Initially started on Unasyn however cellulitis worsened on 9/13 and pt met criteria for sepsis, elevated procalcitonin, tachycardia and Fever > 101.5 F - Antibiotic coverage escalated to vancomycin and Zosyn on 10/26/2012 - Patient is clinically improving, less tenderness to touch and less edema noted on exam this morning - We'll continue same regimen with vancomycin and Zosyn, day #2 - Appreciate pharmacy assistance with dosing - Blood cultures remain negative to date - Appreciate Dr. Ruthe Mannan assistance  Active Problems:   Hypomagnesemia, hypokalemia  - Potassium is still low so we'll supplement this morning with oral K Dur - Magnesium is on low end of normal, we will give additional 2 g IV today - Repeat BMP and magnesium level in the morning    Hypertension - Overall stable      Diabetes mellitus without complication (HCC) - Continue sliding scale insulin    Thrombocytopenia, leukopenia, anemia - Unclear etiology - I don't have any data in Epic to compare - We will ask for records from primary care physician - We'll need to continue close monitoring of blood counts - If persistent drop noted in all 3 cell lines, may need consultation with hematologist   DVT prophylaxis: Lovenox subcutaneous Code Status: Full  Family Communication:  Patient and wife at bedside Disposition Plan: Home once able to take off IV antibiotics  Consultants:   None  Procedures:   None  Antimicrobials:   Unasyn 9/11 --> 9/13  Vancomycin 10/26/2016 -->  Zosyn 10/26/2016 -->  Subjective:  Patient reports feeling better, less edema and erythema in left upper extremity.  Objective: Vitals:   10/26/16 1330 10/26/16 2002 10/26/16 2032 10/27/16 0501  BP: (!) 149/65  131/63 (!) 141/74  Pulse: 92  96 86  Resp: _0 Temp: (!) 100.6 F (38.1 C)  98.2 F (36.8 C) (!) 101 F (38.3 C)  TempSrc: Oral  Oral Oral  SpO2: 98% 92% 98% 98%  Weight:      Height:        Intake/Output Summary (Last 24 hours) at 10/27/16 1034 Last data filed at 10/27/16 0900  Gross per 24 hour  Intake             1485 ml  Output             1300 ml  Net              185 ml   Filed Weights   10/24/16 2125  Weight: 81.5 kg (179 lb 10.8 oz)   Physical Exam  Constitutional: Appears Pleasant and calm, NAD CVS: RRR, S1/S2 +, no murmurs, no gallops, no carotid bruit.  Pulmonary: Effort and breath sounds normal, no stridor, rhonchi, wheezes, rales.  Abdominal: Soft. BS +,  no distension, tenderness, rebound or guarding.  Musculoskeletal: Normal range of motion. Left upper extremity edema  improving overall, less tenderness to palpation, less erythema also noted  Neuro: Alert. Normal reflexes, muscle tone coordination. No cranial nerve deficit. Psychiatric: Normal mood and affect. Behavior, judgment, thought content normal.   Data Reviewed: I have personally reviewed following labs and imaging studies  CBC:  Recent Labs Lab 10/24/16 1733 10/25/16 1546 10/26/16 0441 10/26/16 1505 10/27/16 0554  WBC 11.3* 10.4 7.8 7.1 3.9*  NEUTROABS 9.4*  --   --  6.5  --   HGB 13.4 12.1* 11.8* 11.7* 11.6*  HCT 37.8* 34.5* 34.4* 33.1* 32.9*  MCV 87.1 87.8 88.7 86.9 86.1  PLT 89* 90* 88* 89* 85*   Basic Metabolic Panel:  Recent Labs Lab 10/24/16 1733  10/25/16 1546 10/26/16 0441 10/26/16 1505 10/27/16 0554  NA 138 136  --  135 134*  K 3.2* 3.6  --  3.4* 3.2*  CL 103 108  --  106 105  CO2 22 22  --  21* 22  GLUCOSE 165* 154*  --  170* 136*  BUN 12 13  --  12 11  CREATININE 1.17 1.05  --  0.93 0.92  CALCIUM 8.9 8.1*  --  8.0* 7.8*  MG 1.3* 1.6* 1.9  --  1.7   Liver Function Tests:  Recent Labs Lab 10/24/16 1733 10/26/16 1505  AST 28 34  ALT 19 18  ALKPHOS 52 42  BILITOT 3.0* 2.3*  PROT 6.3* 5.7*  ALBUMIN 3.8 2.9*    Recent Results (from the past 240 hour(s))  Blood Culture (routine x 2)     Status: None (Preliminary result)   Collection Time: 10/24/16  5:31 PM  Result Value Ref Range Status   Specimen Description BLOOD  Final   Special Requests Normal  Final   Culture NO GROWTH 3 DAYS  Final   Report Status PENDING  Incomplete  Blood Culture (routine x 2)     Status: None (Preliminary result)   Collection Time: 10/24/16  5:33 PM  Result Value Ref Range Status   Specimen Description BLOOD LEFT FOREARM  Final   Special Requests   Final    BOTTLES DRAWN AEROBIC AND ANAEROBIC Blood Culture adequate volume   Culture NO GROWTH 3 DAYS  Final   Report Status PENDING  Incomplete  Culture, blood (x 2)     Status: None (Preliminary result)   Collection Time: 10/26/16  3:05 PM  Result Value Ref Range Status   Specimen Description RIGHT ANTECUBITAL  Final   Special Requests   Final    BOTTLES DRAWN AEROBIC AND ANAEROBIC Blood Culture adequate volume   Culture NO GROWTH < 24 HOURS  Final   Report Status PENDING  Incomplete  Culture, blood (x 2)     Status: None (Preliminary result)   Collection Time: 10/26/16  3:05 PM  Result Value Ref Range Status   Specimen Description BLOOD RIGHT HAND  Final   Special Requests   Final    BOTTLES DRAWN AEROBIC AND ANAEROBIC Blood Culture adequate volume   Culture NO GROWTH < 24 HOURS  Final   Report Status PENDING  Incomplete      Radiology Studies: No results  found.   Scheduled Meds: . aspirin  81 mg Oral Daily  . atorvastatin  20 mg Oral QHS  . diazepam  2.5 mg Oral BID  . doxazosin  4 mg Oral QPM  . metoprolol succinate  50 mg Oral BID  . niacin  2,000 mg Oral QHS  . traMADol  50 mg Oral  BID   Continuous Infusions: . sodium chloride 75 mL/hr at 10/27/16 1031     LOS: 3 days   Time spent:  25 minutes   Faye Ramsay, MD [facility] Pager [phone]  If 7PM-7AM, please contact night-coverage [email or web address] Password [facility] 10/27/2016, 10:34 AM

## 2016-10-27 NOTE — Progress Notes (Addendum)
Patient ID: Kyle Molina, male   DOB: [birth year], 76 y.o.   MRN: [id number] LEFT ARM CELLULITIS  BP (!) 141/74 (BP Location: Left Arm)   Pulse 86   Temp (!) 101 F (38.3 C) (Oral)   Resp 20   Ht  (1.702 m)   Wt 179 lb 10.8 oz (81.5 kg)   SpO2 98%   BMI 28.14 kg/m  IMPROVED TODAY   ABLE TO EAT   LESS PAIN LESS TENDER   CONTINUE VANC AND ZOSYN   I DONE SEE NEED FOR I/D NOW   I LL SEE AGAIN TOMORROW   CBC Latest Ref Rng & Units 10/27/2016 10/26/2016 10/26/2016  WBC 4.0 - 10.5 K/uL 3.9(L) 7.1 7.8  Hemoglobin 13.0 - 17.0 g/dL 11.6(L) 11.7(L) 11.8(L)  Hematocrit 39.0 - 52.0 % 32.9(L) 33.1(L) 34.4(L)  Platelets 150 - 400 K/uL 85(L) 89(L) 88(L)

## 2016-10-28 LAB — BASIC METABOLIC PANEL
Anion gap: 9 (ref 5–15)
BUN: 10 mg/dL (ref 6–20)
CALCIUM: 8.1 mg/dL — AB (ref 8.9–10.3)
CO2: 21 mmol/L — AB (ref 22–32)
CREATININE: 0.92 mg/dL (ref 0.61–1.24)
Chloride: 108 mmol/L (ref 101–111)
GFR calc Af Amer: >60 mL/min (ref >60)
GFR calc non Af Amer: >60 mL/min (ref >60)
GLUCOSE: 126 mg/dL — AB (ref 65–99)
Potassium: 3.8 mmol/L (ref 3.5–5.1)
Sodium: 138 mmol/L (ref 135–145)

## 2016-10-28 LAB — CBC
HCT: 34.2 % — ABNORMAL LOW (ref 39.0–52.0)
HEMOGLOBIN: 11.8 g/dL — AB (ref 13.0–17.0)
MCH: 29.9 pg (ref 26.0–34.0)
MCHC: 34.5 g/dL (ref 30.0–36.0)
MCV: 86.6 fL (ref 78.0–100.0)
Platelets: 93 10*3/uL — ABNORMAL LOW (ref 150–400)
RBC: 3.95 MIL/uL — ABNORMAL LOW (ref 4.22–5.81)
RDW: 12.9 % (ref 11.5–15.5)
WBC: 3.7 10*3/uL — ABNORMAL LOW (ref 4.0–10.5)

## 2016-10-28 LAB — MAGNESIUM: MAGNESIUM: 2.1 mg/dL (ref 1.7–2.4)

## 2016-10-28 MED ORDER — TRAMADOL HCL 50 MG PO TABS
50.0000 mg | ORAL_TABLET | Freq: Two times a day (BID) | ORAL | 0 refills | Status: DC
Start: 2016-10-28 — End: 2019-09-09

## 2016-10-28 MED ORDER — AMOXICILLIN-POT CLAVULANATE 875-125 MG PO TABS: 1.0000 | ORAL_TABLET | Freq: Two times a day (BID) | ORAL | 0 refills | Status: AC

## 2016-10-28 NOTE — Progress Notes (Signed)
D/cd both IVs. Reviewed d/c paperwork with patient and gave him his prescriptions. Wheeled him downstairs and helped him and his wife get him into car.

## 2016-10-28 NOTE — Progress Notes (Signed)
Patient ID: Kyle Molina, male   DOB: 06-03-1940, 76 y.o.   MRN: [id number] LEFT HAND CELLULITIS   BP (!) 117/56 (BP Location: Left Arm)   Pulse 77   Temp 98.1 F (36.7 C) (Oral)   Resp 18   Ht  (1.702 m)   Wt 179 lb 10.8 oz (81.5 kg)   SpO2 100%   BMI 28.14 kg/m   I M OK WITH DISCHARGE   CBC Latest Ref Rng & Units 10/28/2016 10/27/2016 10/26/2016  WBC 4.0 - 10.5 K/uL 3.7(L) 3.9(L) 7.1  Hemoglobin 13.0 - 17.0 g/dL 11.8(L) 11.6(L) 11.7(L)  Hematocrit 39.0 - 52.0 % 34.2(L) 32.9(L) 33.1(L)  Platelets 150 - 400 K/uL 93(L) 85(L) 89(L)

## 2016-10-28 NOTE — Discharge Instructions (Signed)

## 2016-10-28 NOTE — Discharge Summary (Addendum)
Physician Discharge Summary  Kyle Molina DZH:[id number] DOB: March 17, 1940 DOA: 10/24/2016  PCP: Aletha Halim., PA-C  Admit date: 10/24/2016 Discharge date: 10/28/2016  Recommendations for Outpatient Follow-up:  1. Pt will need to follow up with PCP in 1-2 weeks post discharge 2. Please obtain BMP to evaluate electrolytes and kidney function 3. Please also check CBC to evaluate Hg and Hct levels, platelets and white blood cell count 4. If persistent drop in all 3 cell lines noted on follow-up, consider further evaluation or consultation with hematologist, possible bone marrow biopsy 5. Augmentin for 7 more days   Discharge Diagnoses:  Principal Problem:   Cellulitis Active Problems:   Hypertension   Hyperlipidemia   Diabetes mellitus without complication (HCC)   Cellulitis of hand, left  Discharge Condition: Stable  Diet recommendation: Heart healthy diet discussed in details   Brief Narrative:  Patient is 76 year old male with known hypertension, hyperlipidemia, diabetes, presented to emergency department with progressively worsening left upper extremity swelling after being bitten by his cat 3 days prior to this admission. This was associated with tenderness to touch, warm to touch as well as fevers and chills.   Assessment & Plan:   Principal Problem:   Sepsis due to Cellulitis of the left upper extremity, cat bite - Initially started on Unasyn however cellulitis worsened on 9/13 and pt met criteria for sepsis, elevated procalcitonin, tachycardia and Fever > 101.5 F - Antibiotic coverage escalated to vancomycin and Zosyn on 10/26/2012 - Patient is clinically improving, less tenderness to touch and less edema noted on exam this morning - Patient has completed 3 days of antibiotic vancomycin and Zosyn, reports feeling better and wants to go home today - Appreciate Dr. Ruthe Mannan assistance, cleared for discharge - Antibiotics changed to Augmentin so that patient can  complete therapy in an outpatient setting  Active Problems:   Hypomagnesemia, hypokalemia  - Both electrolytes supplemented    Hypertension - Reasonable inpatient control      Diabetes mellitus without complication ([place]) - Resume home medical regimen    Thrombocytopenia, leukopenia, anemia - Unclear etiology - I don't have any data in Epic to compare - We'll need to continue close monitoring of blood counts - If persistent drop noted in all 3 cell lines, may need consultation with hematologist  DVT prophylaxis: SCD's Code Status: Full  Family Communication:  patient and wife at bedside Disposition Plan: Home  Consultants:   Hand surgeon Dr. Aline Brochure  Procedures:   None  Antimicrobials:   Unasyn 9/11 --> 9/13  Vancomycin 10/26/2016 --> 10/28/2016  Zosyn 10/26/2016 --> 10/28/2016  Augmentin 10/28/2016 on discharge to complete for 7 more days  Procedures/Studies: Dg Hand Complete Left  Result Date: 10/24/2016 CLINICAL DATA:  Initial evaluation for acute swelling with redness about the thumb and index finger. EXAM: LEFT HAND - COMPLETE 3+ VIEW COMPARISON:  None available. FINDINGS: No acute fracture dislocation. Diffuse soft tissue swelling present about the thumb and index finger. No radiographic findings to suggest erosive or inflammatory arthropathy. 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 Osteopenia. IMPRESSION: 1. No acute fracture or dislocation. 2. Mild diffuse soft tissue swelling about the thumb and index finger. 3. 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 No radiographic evidence for erosive or inflammatory arthropathy. Electronically Signed   By: Marland Kitchen  Jeannine Boga M.D.   On: 10/24/2016 18:47     Discharge Exam: Vitals:   10/27/16 2123 10/28/16  0600  BP: (!) 108/50 (!) 117/56  Pulse: 76 77  Resp: 18 18  Temp: 99.3 F (37.4 C) 98.1 F (36.7 C)  SpO2: 98% 100%   Vitals:   10/27/16 1818 10/27/16 2016 10/27/16 2123 10/28/16 0600  BP: 128/62  (!) 108/50 (!) 117/56  Pulse:   76 77  Resp:   18 18  Temp:   99.3 F (37.4 C) 98.1 F (36.7 C)  TempSrc:   Oral Oral  SpO2:  92% 98% 100%  Weight:      Height:        General: Pt is alert, follows commands appropriately, not in acute distress Cardiovascular: Regular rate and rhythm, S1/S2 +, no murmurs, no rubs, no gallops Respiratory: Clear to auscultation bilaterally, no wheezing, no crackles, no rhonchi Abdominal: Soft, non tender, non distended, bowel sounds +, no guarding Extremities: Left upper extremity edema significantly improved with minimal erythema and mild tenderness to palpation  Discharge Instructions   Allergies as of 10/28/2016      Reactions   Sulfa Antibiotics Itching      Medication List    TAKE these medications   amoxicillin-clavulanate 875-125 MG tablet Commonly known as:  AUGMENTIN Take 1 tablet by mouth every 12 (twelve) hours.   aspirin 81 MG tablet Take 81 mg by mouth daily.   atorvastatin 20 MG tablet Commonly known as:  LIPITOR Take 20 mg by mouth at bedtime.   diazepam 5 MG tablet Commonly known as:  VALIUM Take 2.5 mg by mouth 2 (two) times daily.   doxazosin 4 MG tablet Commonly known as:  CARDURA Take 4 mg by mouth every evening.   metFORMIN 500 MG tablet Commonly known as:  GLUCOPHAGE Take 1,000 mg by mouth daily with supper.   metoprolol succinate 100 MG 24 hr tablet Commonly known as:  TOPROL-XL Take 50 mg by mouth 2 (two) times daily. Take with or immediately following a meal.   niacin 500 MG tablet Take 2,000 mg by mouth at bedtime.   traMADol 50 MG tablet Commonly known as:  ULTRAM Take 1 tablet (50 mg total) by mouth 2 (two) times daily.            Discharge Care Instructions        Start     Ordered    10/28/16 0000  traMADol (ULTRAM) 50 MG tablet  2 times daily     10/28/16 1016   10/28/16 0000  amoxicillin-clavulanate (AUGMENTIN) 875-125 MG tablet  Every 12 hours     10/28/16 1016     Follow-up Information    Aletha Halim., PA-C Follow up.   Specialty:  Family Medicine Contact information: [address] [place] [address] [phone]        Theodis Blaze, MD. Call.   Specialty:  Internal Medicine Contact information: [address] [place] [address] [phone]            The results of significant diagnostics from this hospitalization (including imaging, microbiology, ancillary and laboratory) are listed below for reference.     Microbiology: Recent Results (from the past 240 hour(s))  Blood Culture (routine x 2)     Status: None (Preliminary result)   Collection Time: 10/24/16  5:31 PM  Result Value Ref Range Status   Specimen Description BLOOD  Final   Special Requests Normal  Final   Culture NO GROWTH 4 DAYS  Final   Report Status PENDING  Incomplete  Blood Culture (routine x 2)     Status: None (Preliminary result)   Collection Time: 10/24/16  5:33 PM  Result Value Ref Range Status   Specimen Description BLOOD LEFT FOREARM  Final   Special Requests   Final    BOTTLES DRAWN AEROBIC AND ANAEROBIC Blood Culture adequate volume   Culture NO GROWTH 4 DAYS  Final   Report Status PENDING  Incomplete  Culture, blood (x 2)     Status: None (Preliminary result)   Collection Time: 10/26/16  3:05 PM  Result Value Ref Range Status   Specimen Description RIGHT ANTECUBITAL  Final   Special Requests   Final    BOTTLES DRAWN AEROBIC AND ANAEROBIC Blood Culture adequate volume   Culture NO GROWTH 2 DAYS  Final   Report Status PENDING  Incomplete  Culture, blood (x 2)     Status: None (Preliminary result)   Collection Time: 10/26/16  3:05 PM  Result Value Ref Range Status   Specimen Description BLOOD RIGHT HAND  Final    Special Requests   Final    BOTTLES DRAWN AEROBIC AND ANAEROBIC Blood Culture adequate volume   Culture NO GROWTH 2 DAYS  Final   Report Status PENDING  Incomplete     Labs: Basic Metabolic Panel:  Recent Labs Lab 10/24/16 1733 10/25/16 1546 10/26/16 0441 10/26/16 1505 10/27/16 0554 10/28/16 0553  NA 138 136  --  135 134* 138  K 3.2* 3.6  --  3.4* 3.2* 3.8  CL 103 108  --  106 105 108  CO2 22 22  --  21* 22 21*  GLUCOSE 165* 154*  --  170* 136* 126*  BUN 12 13  --  '12 11 10  ' CREATININE 1.17 1.05  --  0.93 0.92 0.92  CALCIUM 8.9 8.1*  --  8.0* 7.8* 8.1*  MG 1.3* 1.6* 1.9  --  1.7 2.1   Liver Function Tests:  Recent Labs Lab 10/24/16 1733 10/26/16 1505  AST 28 34  ALT 19 18  ALKPHOS 52 42  BILITOT 3.0* 2.3*  PROT 6.3* 5.7*  ALBUMIN 3.8 2.9*   CBC:  Recent Labs Lab 10/24/16 1733 10/25/16 1546 10/26/16 0441 10/26/16 1505 10/27/16 0554 10/28/16 0553  WBC 11.3* 10.4 7.8 7.1 3.9* 3.7*  NEUTROABS 9.4*  --   --  6.5  --   --   HGB 13.4 12.1* 11.8* 11.7* 11.6* 11.8*  HCT 37.8* 34.5* 34.4* 33.1* 32.9* 34.2*  MCV 87.1 87.8 88.7 86.9 86.1 86.6  PLT 89* 90* 88* 89* 85* 93*   SIGNED: Time coordinating discharge: 60 minutes  Faye Ramsay, MD  [facility] 10/28/2016, 10:16 AM Pager [phone]  If 7PM-7AM, please contact night-coverage [email or web address] Password [email or web address]

## 2016-10-29 LAB — CULTURE, BLOOD (ROUTINE X 2)
CULTURE: NO GROWTH
SPECIAL REQUESTS: ADEQUATE

## 2016-10-31 LAB — CULTURE, BLOOD (ROUTINE X 2)
CULTURE: NO GROWTH
Culture: NO GROWTH
SPECIAL REQUESTS: ADEQUATE
Special Requests: ADEQUATE

## 2016-11-02 LAB — CULTURE, BLOOD (ROUTINE X 2)
Culture: NO GROWTH
SPECIAL REQUESTS: ADEQUATE

## 2019-09-09 ENCOUNTER — Emergency Department (HOSPITAL_COMMUNITY): Payer: Medicare HMO

## 2019-09-09 ENCOUNTER — Other Ambulatory Visit: Payer: Self-pay

## 2019-09-09 ENCOUNTER — Encounter (HOSPITAL_COMMUNITY): Payer: Self-pay | Admitting: Emergency Medicine

## 2019-09-09 ENCOUNTER — Emergency Department (HOSPITAL_COMMUNITY)
Admission: EM | Admit: 2019-09-09 | Discharge: 2019-09-09 | Disposition: A | Discharge status: Home / Self Care | Payer: Medicare HMO | Attending: Emergency Medicine | Admitting: Emergency Medicine

## 2019-09-09 DIAGNOSIS — R079 Chest pain, unspecified: Secondary | ICD-10-CM | POA: Diagnosis present

## 2019-09-09 DIAGNOSIS — D696 Thrombocytopenia, unspecified: Secondary | ICD-10-CM | POA: Diagnosis not present

## 2019-09-09 DIAGNOSIS — Z7982 Long term (current) use of aspirin: Secondary | ICD-10-CM | POA: Insufficient documentation

## 2019-09-09 DIAGNOSIS — R0789 Other chest pain: Secondary | ICD-10-CM | POA: Insufficient documentation

## 2019-09-09 DIAGNOSIS — E119 Type 2 diabetes mellitus without complications: Secondary | ICD-10-CM | POA: Insufficient documentation

## 2019-09-09 DIAGNOSIS — I10 Essential (primary) hypertension: Secondary | ICD-10-CM | POA: Insufficient documentation

## 2019-09-09 DIAGNOSIS — Z79899 Other long term (current) drug therapy: Secondary | ICD-10-CM | POA: Diagnosis not present

## 2019-09-09 LAB — BASIC METABOLIC PANEL
Anion gap: 9 (ref 5–15)
BUN: 11 mg/dL (ref 8–23)
CO2: 21 mmol/L — ABNORMAL LOW (ref 22–32)
Calcium: 8.1 mg/dL — ABNORMAL LOW (ref 8.9–10.3)
Chloride: 106 mmol/L (ref 98–111)
Creatinine, Ser: 1.16 mg/dL (ref 0.61–1.24)
GFR calc Af Amer: >60 mL/min (ref >60)
GFR calc non Af Amer: 59 mL/min — ABNORMAL LOW (ref >60)
Glucose, Bld: 172 mg/dL — ABNORMAL HIGH (ref 70–99)
Potassium: 3.7 mmol/L (ref 3.5–5.1)
Sodium: 136 mmol/L (ref 135–145)

## 2019-09-09 LAB — CBC WITH DIFFERENTIAL/PLATELET
Abs Immature Granulocytes: 0.04 10*3/uL (ref 0.00–0.07)
Basophils Absolute: 0 10*3/uL (ref 0.0–0.1)
Basophils Relative: 0 %
Eosinophils Absolute: 0 10*3/uL (ref 0.0–0.5)
Eosinophils Relative: 0 %
HCT: 40.1 % (ref 39.0–52.0)
Hemoglobin: 13.2 g/dL (ref 13.0–17.0)
Immature Granulocytes: 0 %
Lymphocytes Relative: 7 %
Lymphs Abs: 0.6 10*3/uL — ABNORMAL LOW (ref 0.7–4.0)
MCH: 29.6 pg (ref 26.0–34.0)
MCHC: 32.9 g/dL (ref 30.0–36.0)
MCV: 89.9 fL (ref 80.0–100.0)
Monocytes Absolute: 0.7 10*3/uL (ref 0.1–1.0)
Monocytes Relative: 8 %
Neutro Abs: 7.7 10*3/uL (ref 1.7–7.7)
Neutrophils Relative %: 85 %
Platelets: 132 10*3/uL — ABNORMAL LOW (ref 150–400)
RBC: 4.46 MIL/uL (ref 4.22–5.81)
RDW: 12.7 % (ref 11.5–15.5)
WBC: 9.2 10*3/uL (ref 4.0–10.5)
nRBC: 0 % (ref 0.0–0.2)

## 2019-09-09 LAB — TROPONIN I (HIGH SENSITIVITY)
Troponin I (High Sensitivity): 5 ng/L (ref <18)
Troponin I (High Sensitivity): 5 ng/L (ref <18)

## 2019-09-09 MED ORDER — ASPIRIN 81 MG PO CHEW
324.0000 mg | CHEWABLE_TABLET | Freq: Once | ORAL | Status: DC
  Filled 2019-09-09: qty 4

## 2019-09-09 MED ORDER — DEXTROSE-NACL 5-0.9 % IV SOLN: Freq: Once | INTRAVENOUS | Status: DC

## 2019-09-09 NOTE — ED Provider Notes (Signed)
[facility] EMERGENCY DEPARTMENT Provider Note   CSN: [id number] Arrival date & time: 09/09/19  0033   History Chief Complaint  Patient presents with  . Chest Pain    Kyle Molina is a 79 y.o. male.  The history is provided by the patient.  Chest Pain He has history of diabetes, hypertension, hyperlipidemia and comes in because of an episode of chest pain.  Pain started about 3 PM while watching television.  He states the pain is sharp and it is not radiating.  Nothing makes it better, nothing makes it worse.  He denies dyspnea, nausea, diaphoresis.  He denies having had similar pain before.  He is a non-smoker.  He came in by EMS who gave him aspirin and nitroglycerin with partial relief of pain.  Pain was rated at 8/10 initially, is down to 6/10 now.  Past Medical History:  Diagnosis Date  . Diabetes mellitus without complication (HCC)   . Hyperlipidemia   . Hypertension     Patient Active Problem List   Diagnosis Date Noted  . Cellulitis 10/24/2016  . Hypertension   . Hyperlipidemia   . Diabetes mellitus without complication (HCC)   . Cellulitis of hand, left     History reviewed. No pertinent surgical history.     History reviewed. No pertinent family history.  Social History   Tobacco Use  . Smoking status: Never Smoker  . Smokeless tobacco: Never Used  Substance Use Topics  . Alcohol use: No  . Drug use: No    Home Medications Prior to Admission medications   Medication Sig Start Date End Date Taking? Authorizing Provider  aspirin 81 MG tablet Take 81 mg by mouth daily.    [provider]  atorvastatin (LIPITOR) 20 MG tablet Take 20 mg by mouth at bedtime.    [provider]  diazepam (VALIUM) 5 MG tablet Take 2.5 mg by mouth 2 (two) times daily.    [provider]  doxazosin (CARDURA) 4 MG tablet Take 4 mg by mouth every evening.    [provider]  metFORMIN (GLUCOPHAGE) 500 MG tablet Take 1,000 mg by mouth  daily with supper.    [provider]  metoprolol succinate (TOPROL-XL) 100 MG 24 hr tablet Take 50 mg by mouth 2 (two) times daily. Take with or immediately following a meal.    [provider]  niacin 500 MG tablet Take 2,000 mg by mouth at bedtime.    [provider]  traMADol (ULTRAM) 50 MG tablet Take 1 tablet (50 mg total) by mouth 2 (two) times daily. 10/28/16   Dorothea Ogle, MD    Allergies    Sulfa antibiotics  Review of Systems   Review of Systems  Cardiovascular: Positive for chest pain.  All other systems reviewed and are negative.   Physical Exam Updated Vital Signs Pulse 103   Resp 21   Ht 5\' 5"  (1.651 m)   Wt 81.6 kg   BMI 29.95 kg/m   Physical Exam Vitals and nursing note reviewed.   79 year old male, resting comfortably and in no acute distress. Vital signs are normal. Oxygen saturation is 95%, which is normal. Head is normocephalic and atraumatic. PERRLA, EOMI. Oropharynx is clear. Neck is nontender and supple without adenopathy or JVD. Back is nontender and there is no CVA tenderness. Lungs are clear without rales, wheezes, or rhonchi. Chest is nontender. Heart has regular rate and rhythm without murmur. Abdomen is soft, flat, nontender without masses  or hepatosplenomegaly and peristalsis is normoactive. Extremities have no cyanosis or edema, full range of motion is present. Skin is warm and dry without rash. Neurologic: Mental status is normal, cranial nerves are intact, there are no motor or sensory deficits.  ED Results / Procedures / Treatments   Labs (all labs ordered are listed, but only abnormal results are displayed) Labs Reviewed  BASIC METABOLIC PANEL - Abnormal; Notable for the following components:      Result Value   CO2 21 (*)    Glucose, Bld 172 (*)    Calcium 8.1 (*)    GFR calc non Af Amer 59 (*)    All other components within normal limits  CBC WITH DIFFERENTIAL/PLATELET - Abnormal; Notable for the  following components:   Platelets 132 (*)    Lymphs Abs 0.6 (*)    All other components within normal limits  TROPONIN I (HIGH SENSITIVITY)  TROPONIN I (HIGH SENSITIVITY)    EKG EKG Interpretation  Date/Time:  Tuesday September 09 2019 01:16:12 EDT Ventricular Rate:  102 PR Interval:    QRS Duration: 94 QT Interval:  345 QTC Calculation: 450 R Axis:   52 Text Interpretation: Sinus tachycardia Abnormal R-wave progression, early transition When compared with ECG of 10/24/2016, No significant change was found Confirmed by Dione Booze ([id number]) on 09/09/2019 1:22:14 AM   Radiology DG Chest Port 1 View  Result Date: 09/09/2019 CLINICAL DATA:  Chest pain EXAM: PORTABLE CHEST 1 VIEW COMPARISON:  None. FINDINGS: Cardiac shadow is within normal limits. The lungs are well aerated bilaterally. Some patchy airspace opacity is noted in the right mid lung consistent with early infiltrate. No effusion or pneumothorax is noted. No bony abnormality is seen. IMPRESSION: Changes consistent with early infiltrate in the right mid lung. Electronically Signed   By: Alcide Clever M.D.   On: 09/09/2019 01:59    Procedures Procedures   Medications Ordered in ED Medications  aspirin chewable tablet 324 mg (324 mg Oral Not Given 09/09/19 0250)    ED Course  I have reviewed the triage vital signs and the nursing notes.  Pertinent labs & imaging results that were available during my care of the patient were reviewed by me and considered in my medical decision making (see chart for details).  MDM Rules/Calculators/A&P Chest pain which seems somewhat atypical.  ECG shows no acute changes.  Old records are reviewed, and he has no relevant past visits.  Heart score is 3 which puts him at low risk for major adverse cardiac events in the next 6 weeks.  Troponin is pending.  Troponin is normal.  Chest pain has resolved on its own.  Delta troponin is also normal.  On the significant laboratory abnormality is mild  thrombocytopenia which is actually improved over baseline.  He is felt to be safe for discharge, advised to follow-up with PCP.  Also referred to cardiology for consideration for outpatient stress testing.  Final Clinical Impression(s) / ED Diagnoses Final diagnoses:  Atypical chest pain  Thrombocytopenia [facility])    Rx / DC Orders ED Discharge Orders    None       Dione Booze, MD 09/09/19 [phone]

## 2019-09-09 NOTE — Discharge Instructions (Signed)
Return if you are having any problems. 

## 2019-09-09 NOTE — ED Triage Notes (Signed)
Per ems pt c/o cp that started around 3pm yesterday. Ems gave 2 nitro with some relief and 1L bolus of ns.

## 2019-09-23 ENCOUNTER — Other Ambulatory Visit: Payer: Self-pay

## 2019-09-23 ENCOUNTER — Inpatient Hospital Stay (HOSPITAL_COMMUNITY)
Admission: EM | Admit: 2019-09-23 | Discharge: 2019-09-27 | DRG: 872 | Disposition: A | Discharge status: Home Health | Payer: Medicare HMO | Attending: Internal Medicine | Admitting: Internal Medicine

## 2019-09-23 ENCOUNTER — Emergency Department (HOSPITAL_COMMUNITY): Payer: Medicare HMO

## 2019-09-23 ENCOUNTER — Encounter (HOSPITAL_COMMUNITY): Payer: Self-pay

## 2019-09-23 DIAGNOSIS — Z79899 Other long term (current) drug therapy: Secondary | ICD-10-CM | POA: Diagnosis not present

## 2019-09-23 DIAGNOSIS — E119 Type 2 diabetes mellitus without complications: Secondary | ICD-10-CM | POA: Diagnosis present

## 2019-09-23 DIAGNOSIS — N3000 Acute cystitis without hematuria: Secondary | ICD-10-CM | POA: Diagnosis present

## 2019-09-23 DIAGNOSIS — Z20822 Contact with and (suspected) exposure to covid-19: Secondary | ICD-10-CM | POA: Diagnosis present

## 2019-09-23 DIAGNOSIS — F419 Anxiety disorder, unspecified: Secondary | ICD-10-CM | POA: Diagnosis present

## 2019-09-23 DIAGNOSIS — A419 Sepsis, unspecified organism: Secondary | ICD-10-CM | POA: Diagnosis present

## 2019-09-23 DIAGNOSIS — R7989 Other specified abnormal findings of blood chemistry: Secondary | ICD-10-CM

## 2019-09-23 DIAGNOSIS — E538 Deficiency of other specified B group vitamins: Secondary | ICD-10-CM | POA: Diagnosis present

## 2019-09-23 DIAGNOSIS — R531 Weakness: Secondary | ICD-10-CM

## 2019-09-23 DIAGNOSIS — N400 Enlarged prostate without lower urinary tract symptoms: Secondary | ICD-10-CM | POA: Diagnosis present

## 2019-09-23 DIAGNOSIS — E876 Hypokalemia: Secondary | ICD-10-CM | POA: Diagnosis not present

## 2019-09-23 DIAGNOSIS — B9620 Unspecified Escherichia coli [E. coli] as the cause of diseases classified elsewhere: Secondary | ICD-10-CM | POA: Diagnosis present

## 2019-09-23 DIAGNOSIS — R823 Hemoglobinuria: Secondary | ICD-10-CM | POA: Diagnosis present

## 2019-09-23 DIAGNOSIS — N390 Urinary tract infection, site not specified: Secondary | ICD-10-CM | POA: Diagnosis present

## 2019-09-23 DIAGNOSIS — Z7982 Long term (current) use of aspirin: Secondary | ICD-10-CM

## 2019-09-23 DIAGNOSIS — K7589 Other specified inflammatory liver diseases: Secondary | ICD-10-CM | POA: Diagnosis present

## 2019-09-23 DIAGNOSIS — D500 Iron deficiency anemia secondary to blood loss (chronic): Secondary | ICD-10-CM | POA: Diagnosis not present

## 2019-09-23 DIAGNOSIS — I10 Essential (primary) hypertension: Secondary | ICD-10-CM | POA: Diagnosis present

## 2019-09-23 DIAGNOSIS — Z7984 Long term (current) use of oral hypoglycemic drugs: Secondary | ICD-10-CM | POA: Diagnosis not present

## 2019-09-23 DIAGNOSIS — D649 Anemia, unspecified: Secondary | ICD-10-CM

## 2019-09-23 DIAGNOSIS — Z882 Allergy status to sulfonamides status: Secondary | ICD-10-CM

## 2019-09-23 DIAGNOSIS — Z9049 Acquired absence of other specified parts of digestive tract: Secondary | ICD-10-CM | POA: Diagnosis not present

## 2019-09-23 DIAGNOSIS — D696 Thrombocytopenia, unspecified: Secondary | ICD-10-CM | POA: Diagnosis present

## 2019-09-23 DIAGNOSIS — E785 Hyperlipidemia, unspecified: Secondary | ICD-10-CM | POA: Diagnosis present

## 2019-09-23 DIAGNOSIS — R7401 Elevation of levels of liver transaminase levels: Secondary | ICD-10-CM | POA: Diagnosis not present

## 2019-09-23 HISTORY — DX: Other disorders of bilirubin metabolism: E80.6

## 2019-09-23 HISTORY — DX: Anxiety disorder, unspecified: F41.9

## 2019-09-23 HISTORY — DX: Overactive bladder: N32.81

## 2019-09-23 HISTORY — DX: Benign prostatic hyperplasia without lower urinary tract symptoms: N40.0

## 2019-09-23 HISTORY — DX: Thrombocytopenia, unspecified: D69.6

## 2019-09-23 LAB — COMPREHENSIVE METABOLIC PANEL
ALT: 132 U/L — ABNORMAL HIGH (ref 0–44)
AST: 261 U/L — ABNORMAL HIGH (ref 15–41)
Albumin: 3.1 g/dL — ABNORMAL LOW (ref 3.5–5.0)
Alkaline Phosphatase: 69 U/L (ref 38–126)
Anion gap: 10 (ref 5–15)
BUN: 18 mg/dL (ref 8–23)
CO2: 21 mmol/L — ABNORMAL LOW (ref 22–32)
Calcium: 8.2 mg/dL — ABNORMAL LOW (ref 8.9–10.3)
Chloride: 102 mmol/L (ref 98–111)
Creatinine, Ser: 1.21 mg/dL (ref 0.61–1.24)
GFR calc Af Amer: >60 mL/min (ref >60)
GFR calc non Af Amer: 56 mL/min — ABNORMAL LOW (ref >60)
Glucose, Bld: 182 mg/dL — ABNORMAL HIGH (ref 70–99)
Potassium: 4 mmol/L (ref 3.5–5.1)
Sodium: 133 mmol/L — ABNORMAL LOW (ref 135–145)
Total Bilirubin: 3.5 mg/dL — ABNORMAL HIGH (ref 0.3–1.2)
Total Protein: 6.2 g/dL — ABNORMAL LOW (ref 6.5–8.1)

## 2019-09-23 LAB — CBC WITH DIFFERENTIAL/PLATELET
Abs Immature Granulocytes: 0.02 10*3/uL (ref 0.00–0.07)
Basophils Absolute: 0 10*3/uL (ref 0.0–0.1)
Basophils Relative: 0 %
Eosinophils Absolute: 0 10*3/uL (ref 0.0–0.5)
Eosinophils Relative: 0 %
HCT: 36.6 % — ABNORMAL LOW (ref 39.0–52.0)
Hemoglobin: 12.3 g/dL — ABNORMAL LOW (ref 13.0–17.0)
Immature Granulocytes: 0 %
Lymphocytes Relative: 4 %
Lymphs Abs: 0.2 10*3/uL — ABNORMAL LOW (ref 0.7–4.0)
MCH: 29.6 pg (ref 26.0–34.0)
MCHC: 33.6 g/dL (ref 30.0–36.0)
MCV: 88 fL (ref 80.0–100.0)
Monocytes Absolute: 0.5 10*3/uL (ref 0.1–1.0)
Monocytes Relative: 9 %
Neutro Abs: 4.5 10*3/uL (ref 1.7–7.7)
Neutrophils Relative %: 87 %
Platelets: 118 10*3/uL — ABNORMAL LOW (ref 150–400)
RBC: 4.16 MIL/uL — ABNORMAL LOW (ref 4.22–5.81)
RDW: 13 % (ref 11.5–15.5)
WBC: 5.2 10*3/uL (ref 4.0–10.5)
nRBC: 0 % (ref 0.0–0.2)

## 2019-09-23 LAB — URINALYSIS, ROUTINE W REFLEX MICROSCOPIC
Bilirubin Urine: NEGATIVE
Glucose, UA: NEGATIVE mg/dL
Ketones, ur: NEGATIVE mg/dL
Nitrite: NEGATIVE
Protein, ur: NEGATIVE mg/dL
Specific Gravity, Urine: 1.011 (ref 1.005–1.030)
WBC, UA: >50 WBC/hpf — ABNORMAL HIGH (ref 0–5)
pH: 6 (ref 5.0–8.0)

## 2019-09-23 LAB — PROTIME-INR
INR: 1.1 (ref 0.8–1.2)
Prothrombin Time: 13.7 seconds (ref 11.4–15.2)

## 2019-09-23 LAB — LACTIC ACID, PLASMA
Lactic Acid, Venous: 1.7 mmol/L (ref 0.5–1.9)
Lactic Acid, Venous: 1.6 mmol/L (ref 0.5–1.9)

## 2019-09-23 LAB — APTT: aPTT: 39 seconds — ABNORMAL HIGH (ref 24–36)

## 2019-09-23 LAB — SARS CORONAVIRUS 2 BY RT PCR (HOSPITAL ORDER, PERFORMED IN ~~LOC~~ HOSPITAL LAB): SARS Coronavirus 2: NEGATIVE

## 2019-09-23 MED ORDER — ONDANSETRON HCL 4 MG/2ML IJ SOLN: 4.0000 mg | Freq: Four times a day (QID) | INTRAMUSCULAR | Status: DC | PRN

## 2019-09-23 MED ORDER — SODIUM CHLORIDE 0.9 % IV SOLN: INTRAVENOUS | Status: DC

## 2019-09-23 MED ORDER — ACETAMINOPHEN 325 MG PO TABS
650.0000 mg | ORAL_TABLET | Freq: Once | ORAL | Status: AC
  Administered 2019-09-23: 650 mg via ORAL
  Filled 2019-09-23: qty 2

## 2019-09-23 MED ORDER — SODIUM CHLORIDE 0.9 % IV SOLN
1.0000 g | INTRAVENOUS | Status: DC
  Administered 2019-09-24 – 2019-09-26 (×3): 1 g via INTRAVENOUS
  Filled 2019-09-23 (×3): qty 10

## 2019-09-23 MED ORDER — LACTATED RINGERS IV BOLUS (SEPSIS)
1000.0000 mL | Freq: Once | INTRAVENOUS | Status: AC
  Administered 2019-09-23: 1000 mL via INTRAVENOUS

## 2019-09-23 MED ORDER — ONDANSETRON HCL 4 MG PO TABS: 4.0000 mg | ORAL_TABLET | Freq: Four times a day (QID) | ORAL | Status: DC | PRN

## 2019-09-23 MED ORDER — SODIUM CHLORIDE 0.9 % IV SOLN
1.0000 g | Freq: Once | INTRAVENOUS | Status: AC
  Administered 2019-09-23: 1 g via INTRAVENOUS
  Filled 2019-09-23: qty 10

## 2019-09-23 NOTE — H&P (Signed)
History and Physical    Kyle Molina YHC:[id number] DOB: 01/22/1941 DOA: 09/23/2019  PCP: Richmond Campbell., PA-C   Patient coming from: Home.  I have personally briefly reviewed patient's old medical records in [facility]  Chief Complaint: Generalized weakness.  HPI: Kyle Molina is a 79 y.o. male with medical history significant of type 2 diabetes, hyperlipidemia, hypertension who is brought in via EMS to the emergency department after having generalized weakness for about a week.  He is unable to elaborate further on his symptoms, but he is able to answer simple questions.  He denies headaches, chest pain, abdominal or back pain pain at this time.  Notes from Dr. Charm Barges stated that the patient's house had a strong kerosene smell.  ED Course: Initial vital signs were temperature temperature 100.7 F, pulse 110, respirations 20, blood pressure 120/73 mmHg and O2 sat 96% on room air.  He was given a 1000 mL bolus and a gram of ceftriaxone IVPB in the emergency department.  Urinalysis shows moderate hemoglobinuria, large leukocyte esterase, more than 50 WBC and many bacteria on microscopic examination.  CBC showed white count of 5.2 with 87% neutrophils, hemoglobin 12.3 g/dL and platelets 517.  PT 13.7, INR 1.1 and PTT 39.  Lactic acid was 1.7 and then 1.6 mmol/L.  CMP shows a sodium of 133 and CO2 of 21 with a normal anion gap, but all other electrolytes are within normal range.  Renal function is normal.  Glucose under 82 mg/dL.  Total protein 6.2 and albumin 3.1 g/dL.  AST 261 and ALT 132.  Total bilirubin was 3.5 mg/dL.  Imaging: Portable chest radiograph shows resolving infiltrate on the right middle lobe.  RUQ [place] was normal.  Please see images and full radiology report for further detail.  Review of Systems: As per HPI otherwise all other systems reviewed and are negative.  Past Medical History:  Diagnosis Date  . Diabetes mellitus without complication (HCC)   .  Hyperlipidemia   . Hypertension    History reviewed. No pertinent surgical history.  Social History  reports that he has never smoked. He has never used smokeless tobacco. He reports that he does not drink alcohol and does not use drugs.  Allergies  Allergen Reactions  . Sulfa Antibiotics Itching   Family medical history Unable to obtain.  Prior to Admission medications   Medication Sig Start Date End Date Taking? Authorizing Provider  aspirin 81 MG tablet Take 81 mg by mouth daily.   Yes [provider]  atorvastatin (LIPITOR) 20 MG tablet Take 20 mg by mouth at bedtime.   Yes [provider]  diazepam (VALIUM) 5 MG tablet Take 2.5 mg by mouth 2 (two) times daily.   Yes [provider]  doxazosin (CARDURA) 4 MG tablet Take 4 mg by mouth every evening.   Yes [provider]  metFORMIN (GLUCOPHAGE-XR) 500 MG 24 hr tablet Take 1 tablet by mouth daily. 08/29/19  Yes [provider]  niacin (NIASPAN) 500 MG CR tablet Take 1 tablet by mouth in the morning and at bedtime. 08/29/19  Yes [provider]  oxybutynin (DITROPAN-XL) 10 MG 24 hr tablet Take 10 mg by mouth at bedtime. 07/31/19  Yes [provider]  metoprolol succinate (TOPROL-XL) 100 MG 24 hr tablet Take 50 mg by mouth 2 (two) times daily. Take with or immediately following a meal. Patient not taking: Reported on 09/23/2019    [provider]   Physical Exam: Vitals:  09/23/19 1945 09/23/19 2000 09/23/19 2030 09/23/19 2230  BP: 122/70 119/64 124/64 (!) 138/59  Pulse: 88 92 87 89  Resp: (!) 22 20 (!) 23 17  Temp:      TempSrc:      SpO2: 100% 100% 97% 100%   Constitutional: NAD, calm, comfortable Eyes: PERRL, lids and conjunctivae normal.  icteric sclerae. ENMT: Mucous membranes are mildly dry. Posterior pharynx clear of any exudate or lesions. Neck: normal, supple, no masses, no thyromegaly Respiratory: Clear to auscultation bilaterally, no wheezing, no  crackles. Normal respiratory effort. No accessory muscle use.  Cardiovascular: Regular rate and rhythm, no murmurs / rubs / gallops. No extremity edema. 2+ pedal pulses. No carotid bruits.  Abdomen: Nondistended. Bowel sounds positive.   Soft, no tenderness, no masses palpated. No hepatosplenomegaly. Musculoskeletal: no clubbing / cyanosis. Good ROM, no contractures. Normal muscle tone.  Skin: no rashes, lesions, ulcers  Neurologic: CN 2-12 grossly intact. Sensation intact, DTR normal. Strength 5/5 in all 4.  Psychiatric:  Alert and oriented x 2. Normal mood.   Labs on Admission: I have personally reviewed following labs and imaging studies  CBC: Recent Labs  Lab 09/23/19 1344  WBC 5.2  NEUTROABS 4.5  HGB 12.3*  HCT 36.6*  MCV 88.0  PLT 118*   Basic Metabolic Panel: Recent Labs  Lab 09/23/19 1344  NA 133*  K 4.0  CL 102  CO2 21*  GLUCOSE 182*  BUN 18  CREATININE 1.21  CALCIUM 8.2*   GFR: CrCl cannot be calculated (Unknown ideal weight.).  Liver Function Tests: Recent Labs  Lab 09/23/19 1344  AST 261*  ALT 132*  ALKPHOS 69  BILITOT 3.5*  PROT 6.2*  ALBUMIN 3.1*   Urine analysis:    Component Value Date/Time   COLORURINE AMBER (A) 09/23/2019 1311   APPEARANCEUR CLEAR 09/23/2019 1311   LABSPEC 1.011 09/23/2019 1311   PHURINE 6.0 09/23/2019 1311   GLUCOSEU NEGATIVE 09/23/2019 1311   HGBUR MODERATE (A) 09/23/2019 1311   BILIRUBINUR NEGATIVE 09/23/2019 1311   KETONESUR NEGATIVE 09/23/2019 1311   PROTEINUR NEGATIVE 09/23/2019 1311   NITRITE NEGATIVE 09/23/2019 1311   LEUKOCYTESUR LARGE (A) 09/23/2019 1311   Radiological Exams on Admission: DG Chest Port 1 View  Result Date: 09/23/2019 CLINICAL DATA:  Generalized weakness for 1 week EXAM: PORTABLE CHEST 1 VIEW COMPARISON:  09/09/2019 FINDINGS: Cardiac shadow is within normal limits. Previously seen changes in the right mid lung are stable to mildly improved consistent with resolving infiltrate. No new rated  is noted. IMPRESSION: Resolving infiltrate on the right. Electronically Signed   By: Alcide Clever M.D.   On: 09/23/2019 13:46   US Abdomen Limited RUQ  Result Date: 09/23/2019 CLINICAL DATA:  Elevated liver function tests. EXAM: ULTRASOUND ABDOMEN LIMITED RIGHT UPPER QUADRANT COMPARISON:  None. FINDINGS: Gallbladder: No gallstones or wall thickening visualized. No sonographic Murphy sign noted by sonographer. Common bile duct: Diameter: 5 mm Liver: No focal lesion identified. Within normal limits in parenchymal echogenicity. Portal vein is patent on color Doppler imaging with normal direction of blood flow towards the liver. Other: None. IMPRESSION: Normal right upper quadrant ultrasound. Electronically Signed   By: Amie Portland M.D.   On: 09/23/2019 15:50   EKG: Independently reviewed.  Vent. rate 109 BPM PR interval * ms QRS duration 97 ms QT/QTc 391/527 ms P-R-T axes 59 43 43 Sinus tachycardia Borderline repolarization abnormality Prolonged QT interval No significant change since prior 7/21  Assessment/Plan Principal Problem:   Sepsis secondary to  UTI (HCC) Admit to telemetry/inpatient.. Gentle IV hydration. Continue ceftriaxone 1 g IVPB every 24 hours. Follow-up blood culture and sensitivity. Follow-up urine culture and sensitivity.  Active Problems:   Hypertension Continue Cardura 4 mg p.o. nightly. Hold metoprolol     Hyperlipidemia Hold statin and niacin in the setting of hepatitis.    Type 2 diabetes mellitus (HCC) Clear liquids diet. CBG monitoring before meals and bedtime. Check hemoglobin A1c.    Cholestatic hepatitis Check acute hepatitis panel. Follow-up CMP in a.m. Discontinue niacin. Discontinue atorvastatin. Consider GI consult.   DVT prophylaxis: SCDs. Code Status:   Full code. Family Communication: Disposition Plan:   Patient is from:  Home.  Anticipated DC to:  Home.  Anticipated DC date:  09/26/2019.  Anticipated DC barriers: Clinical  improvement.  Culture results.  Consults called: Admission status:  Inpatient/telemetry.  Severity of Illness:  Bobette Mo MD [facility]  How to contact the [facility] Attending or Consulting provider 7A - 7P or covering provider during after hours 7P -7A, for this patient?   1. Check the care team in [facility] and look for a) attending/consulting [facility] provider listed and b) the [facility] team listed 2. Log into [email or web address] and use [place]'s universal password to access. If you do not have the password, please contact the hospital operator. 3. Locate the [facility] provider you are looking for under [facility] and page to a number that you can be directly reached. 4. If you still have difficulty reaching the provider, please page the [facility] (Director on Call) for the Hospitalists listed on amion for assistance.  09/23/2019, 11:50 PM   This document was prepared using Dragon voice recognition software and may contain some unintended transcription errors.

## 2019-09-23 NOTE — ED Notes (Signed)
Pt has urinal at bedside 

## 2019-09-23 NOTE — ED Provider Notes (Signed)
[facility] EMERGENCY DEPARTMENT Provider Note   CSN: [id number] Arrival date & time: 09/23/19  1249     History No chief complaint on file.   Kyle Molina is a 79 y.o. male.  He is brought in from home by EMS for evaluation of generalized weakness for a week.  Patient is a rather poor historian.  When I asked him why he is here he said he short of breath but cannot tell me how long.  Said he fell couple of days ago.  Denies headache chest pain abdominal pain numbness or weakness.  Uses a walker.  EMS states the house smelled like kerosene in the home.  Lives with his wife she should be on her way up.  The history is provided by the patient and the EMS personnel.  Weakness Severity:  Moderate Onset quality:  Gradual Duration:  1 week Timing:  Constant Progression:  Worsening Chronicity:  New Relieved by:  Nothing Worsened by:  Activity Ineffective treatments:  None tried Associated symptoms: falls and shortness of breath   Associated symptoms: no abdominal pain, no chest pain, no cough, no diarrhea, no dysuria, no fever, no frequency, no nausea and no vomiting        Past Medical History:  Diagnosis Date  . Diabetes mellitus without complication ([place])   . Hyperlipidemia   . Hypertension     Patient Active Problem List   Diagnosis Date Noted  . Cellulitis 10/24/2016  . Hypertension   . Hyperlipidemia   . Diabetes mellitus without complication ([place])   . Cellulitis of hand, left     History reviewed. No pertinent surgical history.     No family history on file.  Social History   Tobacco Use  . Smoking status: Never Smoker  . Smokeless tobacco: Never Used  Substance Use Topics  . Alcohol use: No  . Drug use: No    Home Medications Prior to Admission medications   Medication Sig Start Date End Date Taking? Authorizing Provider  aspirin 81 MG tablet Take 81 mg by mouth daily.    [provider]  atorvastatin (LIPITOR) 20 MG tablet Take 20 mg  by mouth at bedtime.    [provider]  diazepam (VALIUM) 5 MG tablet Take 2.5 mg by mouth 2 (two) times daily.    [provider]  doxazosin (CARDURA) 4 MG tablet Take 4 mg by mouth every evening.    [provider]  metFORMIN (GLUCOPHAGE) 500 MG tablet Take 1,000 mg by mouth daily with supper.    [provider]  metoprolol succinate (TOPROL-XL) 100 MG 24 hr tablet Take 50 mg by mouth 2 (two) times daily. Take with or immediately following a meal.    [provider]  niacin 500 MG tablet Take 2,000 mg by mouth at bedtime.    [provider]    Allergies    Sulfa antibiotics  Review of Systems   Review of Systems  Constitutional: Positive for fatigue. Negative for fever.  HENT: Negative for sore throat.   Eyes: Negative for visual disturbance.  Respiratory: Positive for shortness of breath. Negative for cough.   Cardiovascular: Negative for chest pain.  Gastrointestinal: Negative for abdominal pain, diarrhea, nausea and vomiting.  Genitourinary: Negative for dysuria and frequency.  Musculoskeletal: Positive for falls. Negative for joint swelling.  Skin: Negative for rash.  Neurological: Positive for weakness.    Physical Exam Updated Vital Signs BP 127/73 (BP Location: Left Arm)   Pulse Marland Kitchen)  110   Temp (!) 100.7 F (38.2 C) (Oral)   Resp 20   SpO2 96%   Physical Exam Vitals and nursing note reviewed.  Constitutional:      Appearance: Normal appearance. He is well-developed.  HENT:     Head: Normocephalic and atraumatic.  Eyes:     Conjunctiva/sclera: Conjunctivae normal.  Cardiovascular:     Rate and Rhythm: Regular rhythm. Tachycardia present.     Heart sounds: No murmur heard.   Pulmonary:     Effort: Pulmonary effort is normal. No respiratory distress.     Breath sounds: Normal breath sounds.  Abdominal:     Palpations: Abdomen is soft.     Tenderness: There is no abdominal tenderness.  Musculoskeletal:          General: No deformity. Normal range of motion.     Cervical back: Neck supple.  Skin:    General: Skin is warm and dry.     Capillary Refill: Capillary refill takes less than 2 seconds.  Neurological:     General: No focal deficit present.     Mental Status: He is alert.     ED Results / Procedures / Treatments   Labs (all labs ordered are listed, but only abnormal results are displayed) Labs Reviewed  COMPREHENSIVE METABOLIC PANEL - Abnormal; Notable for the following components:      Result Value   Sodium 133 (*)    CO2 21 (*)    Glucose, Bld 182 (*)    Calcium 8.2 (*)    Total Protein 6.2 (*)    Albumin 3.1 (*)    AST 261 (*)    ALT 132 (*)    Total Bilirubin 3.5 (*)    GFR calc non Af Amer 56 (*)    All other components within normal limits  CBC WITH DIFFERENTIAL/PLATELET - Abnormal; Notable for the following components:   RBC 4.16 (*)    Hemoglobin 12.3 (*)    HCT 36.6 (*)    Platelets 118 (*)    Lymphs Abs 0.2 (*)    All other components within normal limits  APTT - Abnormal; Notable for the following components:   aPTT 39 (*)    All other components within normal limits  CULTURE, BLOOD (ROUTINE X 2)  CULTURE, BLOOD (ROUTINE X 2)  URINE CULTURE  SARS CORONAVIRUS 2 BY RT PCR (HOSPITAL ORDER, [place] [facility])  LACTIC ACID, PLASMA  LACTIC ACID, PLASMA  PROTIME-INR  URINALYSIS, ROUTINE W REFLEX MICROSCOPIC    EKG EKG Interpretation  Date/Time:  Tuesday September 23 2019 13:18:30 EDT Ventricular Rate:  109 PR Interval:    QRS Duration: 97 QT Interval:  391 QTC Calculation: 527 R Axis:   43 Text Interpretation: Sinus tachycardia Borderline repolarization abnormality Prolonged QT interval No significant change since prior 7/21 Confirmed by Aletta Edouard [id number]) on 09/23/2019 1:27:25 PM   Radiology DG Chest Port 1 View  Result Date: 09/23/2019 CLINICAL DATA:  Generalized weakness for 1 week EXAM: PORTABLE CHEST 1 VIEW COMPARISON:   09/09/2019 FINDINGS: Cardiac shadow is within normal limits. Previously seen changes in the right mid lung are stable to mildly improved consistent with resolving infiltrate. No new rated is noted. IMPRESSION: Resolving infiltrate on the right. Electronically Signed   By: Inez Catalina M.D.   On: 09/23/2019 13:46   US Abdomen Limited RUQ  Result Date: 09/23/2019 CLINICAL DATA:  Elevated liver function tests. EXAM: ULTRASOUND ABDOMEN LIMITED RIGHT UPPER QUADRANT COMPARISON:  None. FINDINGS: Gallbladder: No gallstones or wall thickening visualized. No sonographic Murphy sign noted by sonographer. Common bile duct: Diameter: 5 mm Liver: No focal lesion identified. Within normal limits in parenchymal echogenicity. Portal vein is patent on color Doppler imaging with normal direction of blood flow towards the liver. Other: None. IMPRESSION: Normal right upper quadrant ultrasound. Electronically Signed   By: Lajean Manes M.D.   On: 09/23/2019 15:50    Procedures Procedures (including critical care time)  Medications Ordered in ED Medications  lactated ringers bolus 1,000 mL (1,000 mLs Intravenous New Bag/Given 09/23/19 1354)  acetaminophen (TYLENOL) tablet 650 mg (650 mg Oral Given 09/23/19 1355)    ED Course  I have reviewed the triage vital signs and the nursing notes.  Pertinent labs & imaging results that were available during my care of the patient were reviewed by me and considered in my medical decision making (see chart for details).  Clinical Course as of Sep 22 1717  Tue Sep 23, 2019  1349 Chest x-ray interpreted by me as no acute infiltrates.  Radiology comments upon resolving right infiltrate.   [MB]  7793 Lab work coming back showing stable hemoglobin.  BUN/creatinine a little bit up.  Low calcium and albumin.  AST and ALT elevated along with T bili.  His bilirubin has been as high as 3 in the past.  Do not see any prior elevations of AST and ALT.  Normal alk phos.   [MB]    Clinical  Course User Index [MB] Hayden Rasmussen, MD   MDM Rules/Calculators/A&P                         This patient complains of weakness shortness of breath; this involves an extensive number of treatment Options and is a complaint that carries with it a high risk of complications and Morbidity. The differential includes dehydration, infection, sepsis, Sirs, pneumonia, UTI, metabolic derangement  I ordered, reviewed and interpreted labs, which included CBC with normal white count, stable low hemoglobin, chemistries with low bicarb elevated glucose, LFTs elevated, lactate normal I ordered medication IV fluids and Tylenol I ordered imaging studies which included chest x-ray and I independently    visualized and interpreted imaging which showed no acute infiltrates Additional history obtained from EMS Previous records obtained and reviewed prior ED visit few weeks ago for chest pain was discharged.  After the interventions stated above, I reevaluated the patient and found patient to be more alert and having no complaints.  He was signed out to oncoming provider Dr. Bobby Rumpf to follow-up on rest of labs and imaging.   Final Clinical Impression(s) / ED Diagnoses Final diagnoses:  LFT elevation    Rx / DC Orders ED Discharge Orders    None       Hayden Rasmussen, MD 09/23/19 1721

## 2019-09-23 NOTE — ED Notes (Signed)
Pt urinated but missed urinal. Unable to obtain specimen at this time.

## 2019-09-23 NOTE — ED Provider Notes (Signed)
Long and extensive work-up to try to explain to the patient's low-grade fever.  Became suspicious that it probably was a urinary tract infection because he peed a few times on the floor so we could not get a sample but it was very strong.  Analysis consistent with UTI urine sent for culture.  Covid testing negative.  Rest of his work-up including the ultrasound of the right upper quadrant due to the LFT abnormalities without any acute findings.  Discussed with the hospitalist who will admit.  Patient was started on Rocephin here.   Vanetta Mulders, MD 09/23/19 [phone]

## 2019-09-23 NOTE — ED Triage Notes (Signed)
EMS reports pt from home and c/o generalized weakness x 1 week.  Reports strong smell of kerosene in the home.  Unable to verify though.  Reports was here recently for chest pain.  Pt appears pale and very weak.  cbg 184.  EMS started 20g iv in r hand.  EKG ST HR 120.  bp 141/64.

## 2019-09-24 DIAGNOSIS — I10 Essential (primary) hypertension: Secondary | ICD-10-CM

## 2019-09-24 DIAGNOSIS — E785 Hyperlipidemia, unspecified: Secondary | ICD-10-CM

## 2019-09-24 DIAGNOSIS — N3000 Acute cystitis without hematuria: Secondary | ICD-10-CM

## 2019-09-24 LAB — COMPREHENSIVE METABOLIC PANEL
ALT: 136 U/L — ABNORMAL HIGH (ref 0–44)
AST: 222 U/L — ABNORMAL HIGH (ref 15–41)
Albumin: 2.7 g/dL — ABNORMAL LOW (ref 3.5–5.0)
Alkaline Phosphatase: 61 U/L (ref 38–126)
Anion gap: 10 (ref 5–15)
BUN: 14 mg/dL (ref 8–23)
CO2: 22 mmol/L (ref 22–32)
Calcium: 8 mg/dL — ABNORMAL LOW (ref 8.9–10.3)
Chloride: 104 mmol/L (ref 98–111)
Creatinine, Ser: 0.94 mg/dL (ref 0.61–1.24)
GFR calc Af Amer: >60 mL/min (ref >60)
GFR calc non Af Amer: >60 mL/min (ref >60)
Glucose, Bld: 149 mg/dL — ABNORMAL HIGH (ref 70–99)
Potassium: 3.5 mmol/L (ref 3.5–5.1)
Sodium: 136 mmol/L (ref 135–145)
Total Bilirubin: 3.7 mg/dL — ABNORMAL HIGH (ref 0.3–1.2)
Total Protein: 5.6 g/dL — ABNORMAL LOW (ref 6.5–8.1)

## 2019-09-24 LAB — CBC WITH DIFFERENTIAL/PLATELET
Abs Immature Granulocytes: 0.01 10*3/uL (ref 0.00–0.07)
Basophils Absolute: 0 10*3/uL (ref 0.0–0.1)
Basophils Relative: 0 %
Eosinophils Absolute: 0 10*3/uL (ref 0.0–0.5)
Eosinophils Relative: 0 %
HCT: 34.9 % — ABNORMAL LOW (ref 39.0–52.0)
Hemoglobin: 11.9 g/dL — ABNORMAL LOW (ref 13.0–17.0)
Immature Granulocytes: 0 %
Lymphocytes Relative: 10 %
Lymphs Abs: 0.4 10*3/uL — ABNORMAL LOW (ref 0.7–4.0)
MCH: 29.7 pg (ref 26.0–34.0)
MCHC: 34.1 g/dL (ref 30.0–36.0)
MCV: 87 fL (ref 80.0–100.0)
Monocytes Absolute: 0.4 10*3/uL (ref 0.1–1.0)
Monocytes Relative: 10 %
Neutro Abs: 3.1 10*3/uL (ref 1.7–7.7)
Neutrophils Relative %: 80 %
Platelets: 104 10*3/uL — ABNORMAL LOW (ref 150–400)
RBC: 4.01 MIL/uL — ABNORMAL LOW (ref 4.22–5.81)
RDW: 13 % (ref 11.5–15.5)
WBC: 3.9 10*3/uL — ABNORMAL LOW (ref 4.0–10.5)
nRBC: 0 % (ref 0.0–0.2)

## 2019-09-24 LAB — HEPATITIS PANEL, ACUTE
HCV Ab: NONREACTIVE
Hep A IgM: NONREACTIVE
Hep B C IgM: NONREACTIVE
Hepatitis B Surface Ag: NONREACTIVE

## 2019-09-24 LAB — GLUCOSE, CAPILLARY
Glucose-Capillary: 134 mg/dL — ABNORMAL HIGH (ref 70–99)
Glucose-Capillary: 145 mg/dL — ABNORMAL HIGH (ref 70–99)
Glucose-Capillary: 114 mg/dL — ABNORMAL HIGH (ref 70–99)
Glucose-Capillary: 150 mg/dL — ABNORMAL HIGH (ref 70–99)

## 2019-09-24 MED ORDER — INSULIN ASPART 100 UNIT/ML ~~LOC~~ SOLN
0.0000 [IU] | Freq: Three times a day (TID) | SUBCUTANEOUS | Status: DC
  Administered 2019-09-24 (×2): 1 [IU] via SUBCUTANEOUS
  Administered 2019-09-25: 2 [IU] via SUBCUTANEOUS
  Administered 2019-09-25: 1 [IU] via SUBCUTANEOUS
  Administered 2019-09-26: 2 [IU] via SUBCUTANEOUS
  Administered 2019-09-26 (×2): 1 [IU] via SUBCUTANEOUS
  Administered 2019-09-27: 2 [IU] via SUBCUTANEOUS
  Administered 2019-09-27: 1 [IU] via SUBCUTANEOUS

## 2019-09-24 MED ORDER — ASPIRIN EC 81 MG PO TBEC
81.0000 mg | DELAYED_RELEASE_TABLET | Freq: Every day | ORAL | Status: DC
  Administered 2019-09-24 – 2019-09-27 (×4): 81 mg via ORAL
  Filled 2019-09-24 (×4): qty 1

## 2019-09-24 MED ORDER — DOXAZOSIN MESYLATE 2 MG PO TABS
4.0000 mg | ORAL_TABLET | Freq: Every evening | ORAL | Status: DC
  Administered 2019-09-24 – 2019-09-26 (×3): 4 mg via ORAL
  Filled 2019-09-24 (×3): qty 2

## 2019-09-24 MED ORDER — DIAZEPAM 5 MG PO TABS
2.5000 mg | ORAL_TABLET | Freq: Two times a day (BID) | ORAL | Status: DC
  Administered 2019-09-24 – 2019-09-27 (×8): 2.5 mg via ORAL
  Filled 2019-09-24 (×8): qty 1

## 2019-09-24 NOTE — Progress Notes (Signed)
PROGRESS NOTE    Kyle Molina  EQA:[id number] DOB: 1940/06/28 DOA: 09/23/2019 PCP: Richmond Campbell., PA-C    Brief Narrative:  HPI: Kyle Molina is a 79 y.o. male with medical history significant of type 2 diabetes, hyperlipidemia, hypertension who is brought in via EMS to the emergency department after having generalized weakness for about a week.  He is unable to elaborate further on his symptoms, but he is able to answer simple questions.  He denies headaches, chest pain, abdominal or back pain pain at this time.  Notes from Dr. Charm Barges stated that the patient's house had a strong kerosene smell.  ED Course: Initial vital signs were temperature temperature 100.7 F, pulse 110, respirations 20, blood pressure 120/73 mmHg and O2 sat 96% on room air.  He was given a 1000 mL bolus and a gram of ceftriaxone IVPB in the emergency department.   Assessment & Plan:   Principal Problem:   Sepsis secondary to UTI [facility]) Active Problems:   Hypertension   Hyperlipidemia   Type 2 diabetes mellitus (HCC)   Cholestatic hepatitis   1. Sepsis secondary to urinary tract infection.  Currently on intravenous Rocephin.  Hemodynamics are stable.  Lactic acid is normal.  Blood cultures are in process, but have shown no growth thus far.  Follow-up urine culture. 2. Hypertension blood pressure stable.  Holding beta-blocker.  Continued on Cardura 3. Hyperlipidemia holding statin in light of elevated LFTs. 4. Diabetes.  Chronically on Metformin which is currently on hold.  Blood sugar stable on sliding scale insulin. 5. Elevated liver function tests.  Patient is noted to have hyperbilirubinemia since 2018.  On his current admission, bilirubin remains elevated, but transaminases are also elevated.  He does not have any right upper quadrant pain or vomiting.  Abdominal ultrasound does not show evidence of CBD dilatation or abnormal liver parenchyma.  Viral hepatitis panel was found to be negative.  Since  this is a chronic finding, it may be reasonable to follow-up with GI as an outpatient.  If transaminases/bilirubin continued to rise, may need inpatient evaluation.  Would continue to hold statin.   DVT prophylaxis: SCDs Start: 09/23/19 2349  Code Status: Full code Family Communication: Discussed with wife at the bedside Disposition Plan: Status is: Inpatient  Remains inpatient appropriate because:IV treatments appropriate due to intensity of illness or inability to take PO   Dispo: The patient is from: Home              Anticipated d/c is to: TBD, may need SNF placement              Anticipated d/c date is: 2 days              Patient currently is not medically stable to d/c.   Consultants:    Procedures:     Antimicrobials:   Ceftriaxone 8/10 >   Subjective: Patient denies any cough, shortness of breath, abdominal pain, nausea or vomiting.  Objective: Vitals:   09/24/19 0149 09/24/19 0202 09/24/19 0410 09/24/19 1700  BP: (!) 142/66  120/74 103/61  Pulse: 93  (!) 105 75  Resp: 16  18 16   Temp: 100 F (37.8 C)  99.3 F (37.4 C) 98 F (36.7 C)  TempSrc:    Oral  SpO2: 100%  99% 98%  Weight:  75.7 kg    Height:  5\' 5"  (1.651 m)      Intake/Output Summary (Last 24 hours) at 09/24/2019 1820 Last data filed at 09/24/2019  0500 Gross per 24 hour  Intake 311.32 ml  Output 0 ml  Net 311.32 ml   Filed Weights   09/24/19 0202  Weight: 75.7 kg    Examination:  General exam: Appears calm and comfortable  Respiratory system: Clear to auscultation. Respiratory effort normal. Cardiovascular system: S1 & S2 heard, RRR. No JVD, murmurs, rubs, gallops or clicks. No pedal edema. Gastrointestinal system: Abdomen is nondistended, soft and nontender. No organomegaly or masses felt. Normal bowel sounds heard. Central nervous system: Alert and oriented. No focal neurological deficits. Extremities: Symmetric 5 x 5 power. Skin: No rashes, lesions or ulcers Psychiatry:  Judgement and insight appear normal. Mood & affect appropriate.     Data Reviewed: I have personally reviewed following labs and imaging studies  CBC: Recent Labs  Lab 09/23/19 1344 09/24/19 0429  WBC 5.2 3.9*  NEUTROABS 4.5 3.1  HGB 12.3* 11.9*  HCT 36.6* 34.9*  MCV 88.0 87.0  PLT 118* 104*   Basic Metabolic Panel: Recent Labs  Lab 09/23/19 1344 09/24/19 0429  NA 133* 136  K 4.0 3.5  CL 102 104  CO2 21* 22  GLUCOSE 182* 149*  BUN 18 14  CREATININE 1.21 0.94  CALCIUM 8.2* 8.0*   GFR: Estimated Creatinine Clearance: 59.6 mL/min (by C-G formula based on SCr of 0.94 mg/dL). Liver Function Tests: Recent Labs  Lab 09/23/19 1344 09/24/19 0429  AST 261* 222*  ALT 132* 136*  ALKPHOS 69 61  BILITOT 3.5* 3.7*  PROT 6.2* 5.6*  ALBUMIN 3.1* 2.7*   No results for input(s): LIPASE, AMYLASE in the last 168 hours. No results for input(s): AMMONIA in the last 168 hours. Coagulation Profile: Recent Labs  Lab 09/23/19 1344  INR 1.1   Cardiac Enzymes: No results for input(s): CKTOTAL, CKMB, CKMBINDEX, TROPONINI in the last 168 hours. BNP (last 3 results) No results for input(s): PROBNP in the last 8760 hours. HbA1C: No results for input(s): HGBA1C in the last 72 hours. CBG: Recent Labs  Lab 09/24/19 0823 09/24/19 1153 09/24/19 1641  GLUCAP 134* 145* 114*   Lipid Profile: No results for input(s): CHOL, HDL, LDLCALC, TRIG, CHOLHDL, LDLDIRECT in the last 72 hours. Thyroid Function Tests: No results for input(s): TSH, T4TOTAL, FREET4, T3FREE, THYROIDAB in the last 72 hours. Anemia Panel: No results for input(s): VITAMINB12, FOLATE, FERRITIN, TIBC, IRON, RETICCTPCT in the last 72 hours. Sepsis Labs: Recent Labs  Lab 09/23/19 1344 09/23/19 1540  LATICACIDVEN 1.7 1.6    Recent Results (from the past 240 hour(s))  Blood Culture (routine x 2)     Status: None (Preliminary result)   Collection Time: 09/23/19  1:44 PM   Specimen: Left Antecubital; Blood  Result  Value Ref Range Status   Specimen Description LEFT ANTECUBITAL  Final   Special Requests   Final    BOTTLES DRAWN AEROBIC AND ANAEROBIC Blood Culture adequate volume   Culture   Final    NO GROWTH < 24 HOURS Performed at [facility], [address], [place], [place] [address]    Report Status PENDING  Incomplete  Blood Culture (routine x 2)     Status: None (Preliminary result)   Collection Time: 09/23/19  1:45 PM   Specimen: Right Antecubital; Blood  Result Value Ref Range Status   Specimen Description RIGHT ANTECUBITAL  Final   Special Requests   Final    BOTTLES DRAWN AEROBIC AND ANAEROBIC Blood Culture adequate volume   Culture   Final    NO GROWTH < 24  HOURS Performed at [facility], [address], [place], [place] [address]    Report Status PENDING  Incomplete  SARS Coronavirus 2 by RT PCR (hospital order, performed in [facility] hospital lab) Nasopharyngeal Nasopharyngeal Swab     Status: None   Collection Time: 09/23/19  4:34 PM   Specimen: Nasopharyngeal Swab  Result Value Ref Range Status   SARS Coronavirus 2 NEGATIVE NEGATIVE Final    Comment: (NOTE) SARS-CoV-2 target nucleic acids are NOT DETECTED.  The SARS-CoV-2 RNA is generally detectable in upper and lower respiratory specimens during the acute phase of infection. The lowest concentration of SARS-CoV-2 viral copies this assay can detect is 250 copies / mL. A negative result does not preclude SARS-CoV-2 infection and should not be used as the sole basis for treatment or other patient management decisions.  A negative result may occur with improper specimen collection / handling, submission of specimen other than nasopharyngeal swab, presence of viral mutation(s) within the areas targeted by this assay, and inadequate number of viral copies (<250 copies / mL). A negative result must be combined with clinical observations, patient history, and epidemiological information.  Fact Sheet for Patients:     BoilerBrush.com.cy  Fact Sheet for Healthcare Providers: https://pope.com/  This test is not yet approved or  cleared by the [place] FDA and has been authorized for detection and/or diagnosis of SARS-CoV-2 by FDA under an Emergency Use Authorization (EUA).  This EUA will remain in effect (meaning this test can be used) for the duration of the COVID-19 declaration under Section 564(b)(1) of the Act, 21 U.S.C. section 360bbb-3(b)(1), unless the authorization is terminated or revoked sooner.  Performed at [facility], [address], [place], [place] [address]          Radiology Studies: [facility] 1 View  Result Date: 09/23/2019 CLINICAL DATA:  Generalized weakness for 1 week EXAM: PORTABLE CHEST 1 VIEW COMPARISON:  09/09/2019 FINDINGS: Cardiac shadow is within normal limits. Previously seen changes in the right mid lung are stable to mildly improved consistent with resolving infiltrate. No new rated is noted. IMPRESSION: Resolving infiltrate on the right. Electronically Signed   By: Alcide Clever M.D.   On: 09/23/2019 13:46   US Abdomen Limited RUQ  Result Date: 09/23/2019 CLINICAL DATA:  Elevated liver function tests. EXAM: ULTRASOUND ABDOMEN LIMITED RIGHT UPPER QUADRANT COMPARISON:  None. FINDINGS: Gallbladder: No gallstones or wall thickening visualized. No sonographic Murphy sign noted by sonographer. Common bile duct: Diameter: 5 mm Liver: No focal lesion identified. Within normal limits in parenchymal echogenicity. Portal vein is patent on color Doppler imaging with normal direction of blood flow towards the liver. Other: None. IMPRESSION: Normal right upper quadrant ultrasound. Electronically Signed   By: Amie Portland M.D.   On: 09/23/2019 15:50        Scheduled Meds: . aspirin EC  81 mg Oral Daily  . diazepam  2.5 mg Oral BID  . doxazosin  4 mg Oral QPM  . insulin aspart  0-9 Units Subcutaneous TID WC    Continuous Infusions: . sodium chloride 75 mL/hr at 09/24/19 0010  . cefTRIAXone (ROCEPHIN)  IV       LOS: 1 day    Time spent:    Erick Blinks, MD [facility]   If 7PM-7AM, please contact night-coverage [email or web address]  09/24/2019, 6:20 PM

## 2019-09-25 ENCOUNTER — Encounter (HOSPITAL_COMMUNITY): Payer: Self-pay | Admitting: Internal Medicine

## 2019-09-25 DIAGNOSIS — D649 Anemia, unspecified: Secondary | ICD-10-CM

## 2019-09-25 DIAGNOSIS — R7989 Other specified abnormal findings of blood chemistry: Secondary | ICD-10-CM

## 2019-09-25 DIAGNOSIS — K7589 Other specified inflammatory liver diseases: Secondary | ICD-10-CM

## 2019-09-25 DIAGNOSIS — D696 Thrombocytopenia, unspecified: Secondary | ICD-10-CM

## 2019-09-25 LAB — COMPREHENSIVE METABOLIC PANEL
ALT: 181 U/L — ABNORMAL HIGH (ref 0–44)
AST: 240 U/L — ABNORMAL HIGH (ref 15–41)
Albumin: 2.5 g/dL — ABNORMAL LOW (ref 3.5–5.0)
Alkaline Phosphatase: 62 U/L (ref 38–126)
Anion gap: 10 (ref 5–15)
BUN: 9 mg/dL (ref 8–23)
CO2: 21 mmol/L — ABNORMAL LOW (ref 22–32)
Calcium: 7.9 mg/dL — ABNORMAL LOW (ref 8.9–10.3)
Chloride: 109 mmol/L (ref 98–111)
Creatinine, Ser: 0.81 mg/dL (ref 0.61–1.24)
GFR calc Af Amer: >60 mL/min (ref >60)
GFR calc non Af Amer: >60 mL/min (ref >60)
Glucose, Bld: 139 mg/dL — ABNORMAL HIGH (ref 70–99)
Potassium: 3.4 mmol/L — ABNORMAL LOW (ref 3.5–5.1)
Sodium: 140 mmol/L (ref 135–145)
Total Bilirubin: 1.6 mg/dL — ABNORMAL HIGH (ref 0.3–1.2)
Total Protein: 5.4 g/dL — ABNORMAL LOW (ref 6.5–8.1)

## 2019-09-25 LAB — CBC WITH DIFFERENTIAL/PLATELET
Abs Immature Granulocytes: 0.02 10*3/uL (ref 0.00–0.07)
Basophils Absolute: 0 10*3/uL (ref 0.0–0.1)
Basophils Relative: 0 %
Eosinophils Absolute: 0 10*3/uL (ref 0.0–0.5)
Eosinophils Relative: 1 %
HCT: 35.2 % — ABNORMAL LOW (ref 39.0–52.0)
Hemoglobin: 11.8 g/dL — ABNORMAL LOW (ref 13.0–17.0)
Immature Granulocytes: 0 %
Lymphocytes Relative: 10 %
Lymphs Abs: 0.5 10*3/uL — ABNORMAL LOW (ref 0.7–4.0)
MCH: 29.4 pg (ref 26.0–34.0)
MCHC: 33.5 g/dL (ref 30.0–36.0)
MCV: 87.6 fL (ref 80.0–100.0)
Monocytes Absolute: 0.5 10*3/uL (ref 0.1–1.0)
Monocytes Relative: 9 %
Neutro Abs: 4.2 10*3/uL (ref 1.7–7.7)
Neutrophils Relative %: 80 %
Platelets: 101 10*3/uL — ABNORMAL LOW (ref 150–400)
RBC: 4.02 MIL/uL — ABNORMAL LOW (ref 4.22–5.81)
RDW: 13.2 % (ref 11.5–15.5)
WBC: 5.3 10*3/uL (ref 4.0–10.5)
nRBC: 0 % (ref 0.0–0.2)

## 2019-09-25 LAB — IRON AND TIBC
Iron: 20 ug/dL — ABNORMAL LOW (ref 45–182)
Saturation Ratios: 10 % — ABNORMAL LOW (ref 17.9–39.5)
TIBC: 198 ug/dL — ABNORMAL LOW (ref 250–450)
UIBC: 178 ug/dL

## 2019-09-25 LAB — GLUCOSE, CAPILLARY
Glucose-Capillary: 139 mg/dL — ABNORMAL HIGH (ref 70–99)
Glucose-Capillary: 160 mg/dL — ABNORMAL HIGH (ref 70–99)
Glucose-Capillary: 119 mg/dL — ABNORMAL HIGH (ref 70–99)

## 2019-09-25 LAB — FERRITIN: Ferritin: 364 ng/mL — ABNORMAL HIGH (ref 24–336)

## 2019-09-25 LAB — DIC (DISSEMINATED INTRAVASCULAR COAGULATION)PANEL
D-Dimer, Quant: 2.48 ug/mL-FEU — ABNORMAL HIGH (ref 0.00–0.50)
Fibrinogen: 627 mg/dL — ABNORMAL HIGH (ref 210–475)
INR: 1.1 (ref 0.8–1.2)
Platelets: 106 10*3/uL — ABNORMAL LOW (ref 150–400)
Prothrombin Time: 14.2 seconds (ref 11.4–15.2)
Smear Review: NONE SEEN
aPTT: 37 seconds — ABNORMAL HIGH (ref 24–36)

## 2019-09-25 LAB — BILIRUBIN, DIRECT: Bilirubin, Direct: 0.8 mg/dL — ABNORMAL HIGH (ref 0.0–0.2)

## 2019-09-25 LAB — PROTIME-INR
INR: 1.1 (ref 0.8–1.2)
Prothrombin Time: 13.8 seconds (ref 11.4–15.2)

## 2019-09-25 NOTE — Evaluation (Addendum)
Physical Therapy Evaluation Patient Details Name: Kyle Molina MRN: [id number] DOB: 1940-11-28 Today's Date: 09/25/2019   History of Present Illness  Kyle Molina is a 79 y.o. male with medical history significant of type 2 diabetes, hyperlipidemia, hypertension who is brought in via EMS to the emergency department after having generalized weakness for about a week.  He is unable to elaborate further on his symptoms, but he is able to answer simple questions.  He denies headaches, chest pain, abdominal or back pain pain at this time.  Notes from Dr. Melina Copa stated that the patient's house had a strong kerosene smell.    Clinical Impression  Patient functioning near baseline for functional mobility and gait, demonstrates good return for ambulating short distances in room without AD and slightly unsteady for out side of room with tendency to lean on nearby objects for support even when using cane.  Patient instructed in use of RW with good return demonstrated and tolerated sitting up in chair after therapy - RN notified.  Plan:  Patient discharged from physical therapy to care of nursing for ambulation daily as tolerated for length of stay.    Follow Up Recommendations Home health PT;Supervision for mobility/OOB;Supervision - Intermittent    Equipment Recommendations  Rolling walker with 5" wheels    Recommendations for Other Services       Precautions / Restrictions Precautions Precautions: Fall Restrictions Weight Bearing Restrictions: No      Mobility  Bed Mobility Overal bed mobility: Modified Independent                Transfers Overall transfer level: Modified independent                  Ambulation/Gait Ambulation/Gait assistance: Supervision Gait Distance (Feet): 80 Feet Assistive device: Rolling walker (2 wheeled);None;Straight cane Gait Pattern/deviations: WFL(Within Functional Limits);Step-to pattern;Decreased step length - left;Decreased stance  time - right;Decreased stride length;Staggering right;Staggering left Gait velocity: decreased   General Gait Details: slightly unsteady cadence with tendency to lean on nearby objects for support, no loss of balance using SPC, but occasional staggering left/right, demonstrates good return for using RW and safer using RW  Stairs            Wheelchair Mobility    Modified Rankin (Stroke Patients Only)       Balance Overall balance assessment: Needs assistance Sitting-balance support: Feet supported;No upper extremity supported Sitting balance-Leahy Scale: Good Sitting balance - Comments: seated at EOB   Standing balance support: During functional activity;No upper extremity supported Standing balance-Leahy Scale: Fair Standing balance comment: fair using SPC and without AD, fair/good using RW                             Pertinent Vitals/Pain Pain Assessment: No/denies pain    Home Living Family/patient expects to be discharged to:: Private residence Living Arrangements: Spouse/significant other Available Help at Discharge: Family;Available 24 hours/day Type of Home: Mobile home Home Access: Stairs to enter Entrance Stairs-Rails: None Entrance Stairs-Number of Steps: 2 Home Layout: One level Home Equipment: Cane - quad      Prior Function Level of Independence: Independent with assistive device(s)         Comments: Community ambulator with Quad-cane PRN, does not drive     Hand Dominance        Extremity/Trunk Assessment   Upper Extremity Assessment Upper Extremity Assessment: Overall WFL for tasks assessed    Lower Extremity Assessment Lower  Extremity Assessment: Generalized weakness    Cervical / Trunk Assessment Cervical / Trunk Assessment: Normal  Communication   Communication: HOH  Cognition Arousal/Alertness: Awake/alert Behavior During Therapy: WFL for tasks assessed/performed Overall Cognitive Status: Within Functional Limits  for tasks assessed                                        General Comments      Exercises     Assessment/Plan    PT Assessment All further PT needs can be met in the next venue of care  PT Problem List Decreased strength;Decreased activity tolerance;Decreased balance;Decreased mobility       PT Treatment Interventions      PT Goals (Current goals can be found in the Care Plan section)  Acute Rehab PT Goals Patient Stated Goal: return home with family to assist PT Goal Formulation: With patient Time For Goal Achievement: 09/25/19 Potential to Achieve Goals: Good    Frequency     Barriers to discharge        Co-evaluation               AM-PAC PT "6 Clicks" Mobility  Outcome Measure Help needed turning from your back to your side while in a flat bed without using bedrails?: None Help needed moving from lying on your back to sitting on the side of a flat bed without using bedrails?: None Help needed moving to and from a bed to a chair (including a wheelchair)?: None Help needed standing up from a chair using your arms (e.g., wheelchair or bedside chair)?: None Help needed to walk in hospital room?: A Little Help needed climbing 3-5 steps with a railing? : A Little 6 Click Score: 22    End of Session   Activity Tolerance: Patient tolerated treatment well;Patient limited by fatigue Patient left: in chair;with call bell/phone within reach Nurse Communication: Mobility status PT Visit Diagnosis: Unsteadiness on feet (R26.81);Other abnormalities of gait and mobility (R26.89);Muscle weakness (generalized) (M62.81)    Time: 0177-9390 PT Time Calculation (min) (ACUTE ONLY): 22 min   Charges:   PT Evaluation $PT Eval Moderate Complexity: 1 Mod PT Treatments $Therapeutic Activity: 8-22 mins        10:31 AM, 09/25/19 Lonell Grandchild, MPT Physical Therapist with [facility] [phone] office [phone] mobile phone

## 2019-09-25 NOTE — Progress Notes (Signed)
[facility]                                                                              Patient Demographics  Kyle Molina, is a 79 y.o. male, DOB - 03-Feb-1941, FWY:[id number]  Admit date - 09/23/2019   Admitting Physician Reubin Milan, MD  Outpatient Primary MD for the patient is Aletha Halim., PA-C  Outpatient specialists:   LOS - 2  days   Medical records reviewed and are as summarized below:    Chief Complaint  Patient presents with  . Weakness       Brief summary   Kyle Collinsis a 79 y.o.malewith medical history significant oftype 2 diabetes, hyperlipidemia, hypertension who is brought in via EMS to the emergency department after having generalized weakness for about a week.He is unable to elaborate further on his symptoms, but he is able to answer simple questions. He denies headaches, chest pain, abdominal or back pain pain at this time. Notes from Dr. Melina Copa stated that the patient'shouse had a strong kerosene smell.  ED Course:Initial vital signs were temperaturetemperature 100.7 F, pulse 110, respirations 20, blood pressure 120/73 mmHg and O2 sat 96% on room air.He was given a 1000 mL bolus and a gram of ceftriaxone IVPB in the emergency department.   Assessment & Plan    Principal Problem:   Sepsis secondary to UTI [facility]) -Patient met sepsis criteria at the time of admission with fevers, tachycardia, mild DKA, transaminitis, likely secondary to UTI.  UA positive for UTI -Urine culture showed more than 100,000 colonies of E. Coli -Continue IV Rocephin, blood cultures negative so far   Active Problems:   Hypertension -BP currently stable    Hyperlipidemia -Hold statin secondary to elevated LFTs    Type 2 diabetes mellitus (HCC) -Continue sliding scale insulin, hold Metformin    Cholestatic hepatitis, transaminitis -On chart review, shows chronic history of elevated total bilirubin however transaminases  has been normal, history of chronic thrombocytopenia.  Alkaline phosphatase normal. -Abdominal ultrasound did not show any CBD dilation, normal liver parenchyma, portal vein patent.  Continue to hold statin -Viral hepatitis negative.  No new medications -Total bili trending down however transaminases up, GI consulted for further recommendations   Chronic thrombocytopenia (HCC) -Platelets slowly trending down, check PT/INR, reticulocyte count  Chronic normocytic anemia Panel showed ferritin 364, iron stores low 20, percent saturation 10 -Follow FOBT to rule out any chronic blood loss     Code Status: Full CODE STATUS DVT Prophylaxis:   SCD's Family Communication: Discussed all imaging results, lab results, explained to the patient    Disposition Plan:     Status is: Inpatient  Remains inpatient appropriate because:Inpatient level of care appropriate due to severity of illness   Dispo: The patient is from: Home              Anticipated d/c is to: Home              Anticipated d/c date is: 1 day              Patient currently is not medically stable to d/c.  Pending  GI work-up      Time Spent in minutes   35 min  Procedures:  Abdominal ultrasound  Consultants:   Gastroenterology  Antimicrobials:   Anti-infectives (From admission, onward)   Start     Dose/Rate Route Frequency Ordered Stop   09/24/19 2200  cefTRIAXone (ROCEPHIN) 1 g in sodium chloride 0.9 % 100 mL IVPB     Discontinue     1 g 200 mL/hr over 30 Minutes Intravenous Every 24 hours 09/23/19 2346     09/23/19 2315  cefTRIAXone (ROCEPHIN) 1 g in sodium chloride 0.9 % 100 mL IVPB        1 g 200 mL/hr over 30 Minutes Intravenous  Once 09/23/19 2309 09/24/19 0028          Medications  Scheduled Meds: . aspirin EC  81 mg Oral Daily  . diazepam  2.5 mg Oral BID  . doxazosin  4 mg Oral QPM  . insulin aspart  0-9 Units Subcutaneous TID WC   Continuous Infusions: . sodium chloride 75 mL/hr at  09/25/19 0155  . cefTRIAXone (ROCEPHIN)  IV 1 g (09/24/19 2125)   PRN Meds:.ondansetron **OR** ondansetron (ZOFRAN) IV      Subjective:   Kyle Molina was seen and examined today.  Per patient, no acute complaints, no abdominal pain, nausea or vomiting.  No fevers.  No rash.  Patient denies dizziness, chest pain, shortness of breath, new weakness, numbess, tingling. No acute events overnight.    Objective:   Vitals:   09/24/19 0410 09/24/19 1700 09/24/19 2137 09/25/19 0447  BP: 120/74 103/61 130/72 (!) 117/54  Pulse: (!) 105 75 85 81  Resp: _0 Temp: 99.3 F (37.4 C) 98 F (36.7 C) 98 F (36.7 C) 99.3 F (37.4 C)  TempSrc:  Oral Oral   SpO2: 99% 98% 96% 98%  Weight:      Height:        Intake/Output Summary (Last 24 hours) at 09/25/2019 1417 Last data filed at 09/25/2019 1300 Gross per 24 hour  Intake 360 ml  Output 2050 ml  Net -1690 ml     Wt Readings from Last 3 Encounters:  09/24/19 75.7 kg  09/09/19 81.6 kg  10/24/16 81.5 kg     Exam  General: Alert and oriented x 3, NAD  Cardiovascular: S1 S2 auscultated, no murmurs, RRR  Respiratory: Clear to auscultation bilaterally, no wheezing, rales or rhonchi  Gastrointestinal: Soft, nontender, nondistended, + bowel sounds  Ext: no pedal edema bilaterally  Neuro:  Strength 5/5 upper and lower extremities bilaterally  Musculoskeletal: No digital cyanosis, clubbing  Skin: No rashes  Psych: Normal affect and demeanor, alert and oriented x3    Data Reviewed:  I have personally reviewed following labs and imaging studies  Micro Results Recent Results (from the past 240 hour(s))  Urine culture     Status: Abnormal (Preliminary result)   Collection Time: 09/23/19  1:11 PM   Specimen: In/Out Cath Urine  Result Value Ref Range Status   Specimen Description   Final    IN/OUT CATH URINE Performed at [facility], [address] [place], [place] [address]    Special Requests   Final     NONE Performed at [facility], [address], [place], [place] [address]    Culture (A)  Final    >=100,000 COLONIES/mL ESCHERICHIA COLI CULTURE REINCUBATED FOR BETTER GROWTH SUSCEPTIBILITIES TO FOLLOW Performed at [facility], [address] [place], [place]  [address]    Report Status PENDING  Incomplete  Blood Culture (routine x 2)     Status: None (Preliminary result)   Collection Time: 09/23/19  1:44 PM   Specimen: Left Antecubital; Blood  Result Value Ref Range Status   Specimen Description LEFT ANTECUBITAL  Final   Special Requests   Final    BOTTLES DRAWN AEROBIC AND ANAEROBIC Blood Culture adequate volume   Culture   Final    NO GROWTH < 24 HOURS Performed at [facility], [address], [place], [place] [address]    Report Status PENDING  Incomplete  Blood Culture (routine x 2)     Status: None (Preliminary result)   Collection Time: 09/23/19  1:45 PM   Specimen: Right Antecubital; Blood  Result Value Ref Range Status   Specimen Description RIGHT ANTECUBITAL  Final   Special Requests   Final    BOTTLES DRAWN AEROBIC AND ANAEROBIC Blood Culture adequate volume   Culture   Final    NO GROWTH < 24 HOURS Performed at [facility], [address] [place], [place] [address]    Report Status PENDING  Incomplete  SARS Coronavirus 2 by RT PCR (hospital order, performed in [place] hospital lab) Nasopharyngeal Nasopharyngeal Swab     Status: None   Collection Time: 09/23/19  4:34 PM   Specimen: Nasopharyngeal Swab  Result Value Ref Range Status   SARS Coronavirus 2 NEGATIVE NEGATIVE Final    Comment: (NOTE) SARS-CoV-2 target nucleic acids are NOT DETECTED.  The SARS-CoV-2 RNA is generally detectable in upper and lower respiratory specimens during the acute phase of infection. The lowest concentration of SARS-CoV-2 viral copies this assay can detect is 250 copies / mL. A negative result does not preclude SARS-CoV-2 infection and should not be used as the sole  basis for treatment or other patient management decisions.  A negative result may occur with improper specimen collection / handling, submission of specimen other than nasopharyngeal swab, presence of viral mutation(s) within the areas targeted by this assay, and inadequate number of viral copies (<250 copies / mL). A negative result must be combined with clinical observations, patient history, and epidemiological information.  Fact Sheet for Patients:   StrictlyIdeas.no  Fact Sheet for Healthcare Providers: BankingDealers.co.za  This test is not yet approved or  cleared by the [place] FDA and has been authorized for detection and/or diagnosis of SARS-CoV-2 by FDA under an Emergency Use Authorization (EUA).  This EUA will remain in effect (meaning this test can be used) for the duration of the COVID-19 declaration under Section 564(b)(1) of the Act, 21 U.S.C. section 360bbb-3(b)(1), unless the authorization is terminated or revoked sooner.  Performed at [facility], [address], [place],  [address]     Radiology Reports DG Chest Canon 1 View  Result Date: 09/23/2019 CLINICAL DATA:  Generalized weakness for 1 week EXAM: PORTABLE CHEST 1 VIEW COMPARISON:  09/09/2019 FINDINGS: Cardiac shadow is within normal limits. Previously seen changes in the right mid lung are stable to mildly improved consistent with resolving infiltrate. No new rated is noted. IMPRESSION: Resolving infiltrate on the right. Electronically Signed   By: Inez Catalina M.D.   On: 09/23/2019 13:46   DG Chest Port 1 View  Result Date: 09/09/2019 CLINICAL DATA:  Chest pain EXAM: PORTABLE CHEST 1 VIEW COMPARISON:  None. FINDINGS: Cardiac shadow is within normal limits. The lungs are well aerated bilaterally. Some patchy airspace opacity is noted in the right mid lung  consistent with early infiltrate. No effusion or pneumothorax is noted. No bony abnormality is  seen. IMPRESSION: Changes consistent with early infiltrate in the right mid lung. Electronically Signed   By: Inez Catalina M.D.   On: 09/09/2019 01:59   US Abdomen Limited RUQ  Result Date: 09/23/2019 CLINICAL DATA:  Elevated liver function tests. EXAM: ULTRASOUND ABDOMEN LIMITED RIGHT UPPER QUADRANT COMPARISON:  None. FINDINGS: Gallbladder: No gallstones or wall thickening visualized. No sonographic Murphy sign noted by sonographer. Common bile duct: Diameter: 5 mm Liver: No focal lesion identified. Within normal limits in parenchymal echogenicity. Portal vein is patent on color Doppler imaging with normal direction of blood flow towards the liver. Other: None. IMPRESSION: Normal right upper quadrant ultrasound. Electronically Signed   By: Lajean Manes M.D.   On: 09/23/2019 15:50    Lab Data:  CBC: Recent Labs  Lab 09/23/19 1344 09/24/19 0429 09/25/19 0606  WBC 5.2 3.9* 5.3  NEUTROABS 4.5 3.1 4.2  HGB 12.3* 11.9* 11.8*  HCT 36.6* 34.9* 35.2*  MCV 88.0 87.0 87.6  PLT 118* 104* 291*   Basic Metabolic Panel: Recent Labs  Lab 09/23/19 1344 09/24/19 0429 09/25/19 0606  NA 133* 136 140  K 4.0 3.5 3.4*  CL 102 104 109  CO2 21* 22 21*  GLUCOSE 182* 149* 139*  BUN _0 CREATININE 1.21 0.94 0.81  CALCIUM 8.2* 8.0* 7.9*   GFR: Estimated Creatinine Clearance: 69.1 mL/min (by C-G formula based on SCr of 0.81 mg/dL). Liver Function Tests: Recent Labs  Lab 09/23/19 1344 09/24/19 0429 09/25/19 0606  AST 261* 222* 240*  ALT 132* 136* 181*  ALKPHOS 69 61 62  BILITOT 3.5* 3.7* 1.6*  PROT 6.2* 5.6* 5.4*  ALBUMIN 3.1* 2.7* 2.5*   No results for input(s): LIPASE, AMYLASE in the last 168 hours. No results for input(s): AMMONIA in the last 168 hours. Coagulation Profile: Recent Labs  Lab 09/23/19 1344  INR 1.1   Cardiac Enzymes: No results for input(s): CKTOTAL, CKMB, CKMBINDEX, TROPONINI in the last 168 hours. BNP (last 3 results) No results for input(s): PROBNP in the  last 8760 hours. HbA1C: No results for input(s): HGBA1C in the last 72 hours. CBG: Recent Labs  Lab 09/24/19 1153 09/24/19 1641 09/24/19 2135 09/25/19 0836 09/25/19 1205  GLUCAP 145* 114* 150* 139* 160*   Lipid Profile: No results for input(s): CHOL, HDL, LDLCALC, TRIG, CHOLHDL, LDLDIRECT in the last 72 hours. Thyroid Function Tests: No results for input(s): TSH, T4TOTAL, FREET4, T3FREE, THYROIDAB in the last 72 hours. Anemia Panel: Recent Labs    09/23/19 1356  FERRITIN 364*  TIBC 198*  IRON 20*   Urine analysis:    Component Value Date/Time   COLORURINE AMBER (A) 09/23/2019 1311   APPEARANCEUR CLEAR 09/23/2019 1311   LABSPEC 1.011 09/23/2019 1311   PHURINE 6.0 09/23/2019 1311   GLUCOSEU NEGATIVE 09/23/2019 1311   HGBUR MODERATE (A) 09/23/2019 1311   BILIRUBINUR NEGATIVE 09/23/2019 1311   KETONESUR NEGATIVE 09/23/2019 1311   PROTEINUR NEGATIVE 09/23/2019 1311   NITRITE NEGATIVE 09/23/2019 1311   LEUKOCYTESUR LARGE (A) 09/23/2019 1311     Kyle Molina M.D. [facility] 09/25/2019, 2:17 PM   Call night coverage person covering after 7pm

## 2019-09-25 NOTE — Progress Notes (Addendum)
Referring Provider: Mendel Corning, MD Primary Care Physician:  Aletha Halim., PA-C Primary Gastroenterologist:  Formerly unassigned, now Garfield Cornea, MD Reason for Consultation:  LFTs trending upward  HPI: Kyle Molina is a 79 y.o. male with past medical history of type 2 diabetes, hyperlipidemia, hypertension brought to the ED via EMS for generalized weakness for about a week.  GI being consulted today for abnormal LFTs with upward trend.  In the ED his temperature was 100.7.  Pulse 110, blood pressure 120/73, O2 sats 96% on room air.  Urinalysis showed moderate hemoglobinuria, large leukocyte esterase, more than 50 WBC and many bacteria.  White blood cell count 5200, hemoglobin 12.3, platelets 118,000, INR 1.1, lactic acid 1.7, sodium 133, total bilirubin 3.5, AST 261, ALT 123, alkaline phosphatase 69, glucose 182.  BUN 18, creatinine 1.21.  SARS coronavirus 2 negative.  Chest x-ray showed resolving infiltrate on the right.  Notably chest x-ray July 27 showed evidence of early infiltrate in the right midlung.  He has been seen in the ED July 27 with chest pain felt to be atypical.  Urine culture currently growing gram-negative rods, ID/sensitivities pending.  Patient currently being treated with Rocephin.  Statin on hold due to abnormal LFTs.  Patient with documented elevated total bilirubin, not differentiated, dating back several years. Reviewed care everywhere, labs July 31, 2019 with total bilirubin 1.6, not differentiated, alk phos 82, AST 25, ALT 22.    On presentation this admission, total bilirubin 3.5, AST 261, ALT 132.  Yesterday total bilirubin 3.7, AST 22, ALT 136.  Today total bilirubin 1.6, AST 240, ALT 181.  Right upper quadrant ultrasound normal.  Hepatitis B surface antigen negative, hepatitis C antibody negative, hepatitis A IgM negative, hepatitis B core IgM negative.  Patient denies abdominal pain or vomiting. Appetite has been good. Drinking plenty of fluids. States  that he has been feeling weak for one week. [place] a couple of days ago but denies injury or laying in the floor for prolonged period of time. BMs have been regular. States no blood per rectum but stool this admission was "black". None reported by nursing. No heartburn. Takes ASA 70m daily but no other NSAIDS.   His hemoglobin has been slightly low this admission. 07/2019: Hgb 14.3. two weeks ago Hgb 13.2. On admission Hgb 12.3 and stable last 24 hours at 11.8. 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 In 07/2019, platelets 126,000.    Prior to Admission medications   Medication Sig Start Date End Date Taking? Authorizing Provider  aspirin 81 MG tablet Take 81 mg by mouth daily.   Yes [provider]  atorvastatin (LIPITOR) 20 MG tablet Take 20 mg by mouth at bedtime.   Yes [provider]  diazepam (VALIUM) 5 MG tablet Take 2.5 mg by mouth 2 (two) times daily.   Yes [provider]  doxazosin (CARDURA) 4 MG tablet Take 4 mg by mouth every evening.   Yes [provider]  metFORMIN (GLUCOPHAGE-XR) 500 MG 24 hr tablet Take 1 tablet by mouth daily. 08/29/19  Yes [provider]  niacin (NIASPAN) 500 MG CR tablet Take 1 tablet by mouth in the morning and at bedtime. 08/29/19  Yes [provider]  oxybutynin (DITROPAN-XL) 10 MG 24 hr tablet Take 10 mg by mouth at bedtime. 07/31/19  Yes [provider]  metoprolol succinate (TOPROL-XL) 100 MG 24 hr tablet Take 50 mg by mouth 2 (two) times daily. Take with or immediately following  a meal. Patient not taking: Reported on 09/23/2019    [provider]    Current Facility-Administered Medications  Medication Dose Route Frequency Provider Last Rate Last Admin   0.9 %  sodium chloride infusion   Intravenous Continuous Reubin Milan, MD 75 mL/hr at 09/25/19 0155 New Bag at 09/25/19 0155   aspirin EC tablet 81 mg  81 mg Oral Daily Reubin Milan, MD   81 mg at  09/25/19 2637   cefTRIAXone (ROCEPHIN) 1 g in sodium chloride 0.9 % 100 mL IVPB  1 g Intravenous Q24H Reubin Milan, MD 200 mL/hr at 09/24/19 2125 1 g at 09/24/19 2125   diazepam (VALIUM) tablet 2.5 mg  2.5 mg Oral BID Reubin Milan, MD   2.5 mg at 09/25/19 8588   doxazosin (CARDURA) tablet 4 mg  4 mg Oral QPM Reubin Milan, MD   4 mg at 09/24/19 1707   insulin aspart (novoLOG) injection 0-9 Units  0-9 Units Subcutaneous TID WC Reubin Milan, MD   1 Units at 09/25/19 0851   ondansetron (ZOFRAN) tablet 4 mg  4 mg Oral Q6H PRN Reubin Milan, MD       Or   ondansetron [facility]) injection 4 mg  4 mg Intravenous Q6H PRN Reubin Milan, MD        Allergies as of 09/23/2019 - Review Complete 09/23/2019  Allergen Reaction Noted   Sulfa antibiotics Itching 10/24/2016    Past Medical History:  Diagnosis Date   Anxiety    BPH (benign prostatic hyperplasia)    Diabetes mellitus without complication ([place])    Hyperbilirubinemia    Hyperlipidemia    Hypertension    Overactive bladder    Thrombocytopenia ([place])     Past Surgical History:  Procedure Laterality Date   APPENDECTOMY     COLONOSCOPY  2010?   Dr. Jim Desanctis. patient states he was "all clear"    History reviewed. No pertinent family history.  Social History   Socioeconomic History   Marital status: Married    Spouse name: Not on file   Number of children: Not on file   Years of education: Not on file   Highest education level: Not on file  Occupational History   Not on file  Tobacco Use   Smoking status: Never Smoker   Smokeless tobacco: Never Used  Substance and Sexual Activity   Alcohol use: No   Drug use: No   Sexual activity: Not on file  Other Topics Concern   Not on file  Social History Narrative   Not on file   Social Determinants of Health   Financial Resource Strain:    Difficulty of Paying Living Expenses:   Food Insecurity:    Worried  About [place] in the Last Year:    Arboriculturist in the Last Year:   Transportation Needs:    Film/video editor (Medical):    Lack of Transportation (Non-Medical):   Physical Activity:    Days of Exercise per Week:    Minutes of Exercise per Session:   Stress:    Feeling of Stress :   Social Connections:    Frequency of Communication with Friends and Family:    Frequency of Social Gatherings with Friends and Family:    Attends Religious Services:    Active Member of Clubs or Organizations:    Attends Archivist Meetings:    Marital Status:   Intimate Production manager  Violence:    Fear of Current or Ex-Partner:    Emotionally Abused:    Physically Abused:    Sexually Abused:      ROS:  General: Negative for anorexia, weight loss, fever, chills, fatigue,+ weakness. Eyes: Negative for vision changes.  ENT: Negative for hoarseness, difficulty swallowing , nasal congestion. CV: Negative for chest pain, angina, palpitations, dyspnea on exertion, peripheral edema.  Respiratory: Negative for dyspnea at rest, dyspnea on exertion, cough, sputum, wheezing.  GI: See history of present illness. GU:  Negative for dysuria, hematuria, urinary incontinence, urinary frequency, nocturnal urination.  MS: Negative for joint pain, low back pain.  Derm: Negative for rash or itching.  Neuro: Negative for weakness, abnormal sensation, seizure, frequent headaches, memory loss, confusion.  Psych: Negative for anxiety, depression, suicidal ideation, hallucinations.  Endo: Negative for unusual weight change.  Heme: Negative for bruising or bleeding. Allergy: Negative for rash or hives.       Physical Examination: Vital signs in last 24 hours: Temp:  [98 F (36.7 C)-99.3 F (37.4 C)] 99.3 F (37.4 C) (08/12 0447) Pulse Rate:  [75-85] 81 (08/12 0447) Resp:  [16-18] 18 (08/12 0447) BP: (103-130)/(54-72) 117/54 (08/12 0447) SpO2:  [96 %-98 %] 98 % (08/12  0447) Last BM Date: 09/23/19  General: Well-nourished, well-developed in no acute distress. Wife at bedside. Patient able to provide history.  Head: Normocephalic, atraumatic.   Eyes: Conjunctiva pink, no icterus. Mouth: Oropharyngeal mucosa moist and pink , no lesions erythema or exudate. Neck: Supple without thyromegaly, masses, or lymphadenopathy.  Lungs: Clear to auscultation bilaterally.  Heart: Regular rate and rhythm, no murmurs rubs or gallops.  Abdomen: Bowel sounds are normal, nontender, nondistended, no hepatosplenomegaly or masses, no abdominal bruits or    hernia , no rebound or guarding.   Rectal: not performed Extremities: No lower extremity edema, clubbing, deformity.  Neuro: Alert and oriented x 4 , grossly normal neurologically.  Skin: Warm and dry, no rash or jaundice.   Psych: Alert and cooperative, normal mood and affect.        Intake/Output from previous day: 08/11 0701 - 08/12 0700 In: -  Out: 1600 [Urine:1600] Intake/Output this shift: No intake/output data recorded.  Lab Results: CBC Recent Labs    09/23/19 1344 09/24/19 0429 09/25/19 0606  WBC 5.2 3.9* 5.3  HGB 12.3* 11.9* 11.8*  HCT 36.6* 34.9* 35.2*  MCV 88.0 87.0 87.6  PLT 118* 104* 101*   BMET Recent Labs    09/23/19 1344 09/24/19 0429 09/25/19 0606  NA 133* 136 140  K 4.0 3.5 3.4*  CL 102 104 109  CO2 21* 22 21*  GLUCOSE 182* 149* 139*  BUN '18 14 9  ' CREATININE 1.21 0.94 0.81  CALCIUM 8.2* 8.0* 7.9*   LFT Recent Labs    09/23/19 1344 09/24/19 0429 09/25/19 0606  BILITOT 3.5* 3.7* 1.6*  ALKPHOS 69 61 62  AST 261* 222* 240*  ALT 132* 136* 181*  PROT 6.2* 5.6* 5.4*  ALBUMIN 3.1* 2.7* 2.5*    Lipase No results for input(s): LIPASE in the last 72 hours.  PT/INR Recent Labs    09/23/19 1344  LABPROT 13.7  INR 1.1      Imaging Studies: DG Chest Port 1 View  Result Date: 09/23/2019 CLINICAL DATA:  Generalized weakness for 1 week EXAM: PORTABLE CHEST 1 VIEW  COMPARISON:  09/09/2019 FINDINGS: Cardiac shadow is within normal limits. Previously seen changes in the right mid lung are stable to mildly improved consistent with  resolving infiltrate. No new rated is noted. IMPRESSION: Resolving infiltrate on the right. Electronically Signed   By: Inez Catalina M.D.   On: 09/23/2019 13:46   DG Chest Port 1 View  Result Date: 09/09/2019 CLINICAL DATA:  Chest pain EXAM: PORTABLE CHEST 1 VIEW COMPARISON:  None. FINDINGS: Cardiac shadow is within normal limits. The lungs are well aerated bilaterally. Some patchy airspace opacity is noted in the right mid lung consistent with early infiltrate. No effusion or pneumothorax is noted. No bony abnormality is seen. IMPRESSION: Changes consistent with early infiltrate in the right mid lung. Electronically Signed   By: Inez Catalina M.D.   On: 09/09/2019 01:59   US Abdomen Limited RUQ  Result Date: 09/23/2019 CLINICAL DATA:  Elevated liver function tests. EXAM: ULTRASOUND ABDOMEN LIMITED RIGHT UPPER QUADRANT COMPARISON:  None. FINDINGS: Gallbladder: No gallstones or wall thickening visualized. No sonographic Murphy sign noted by sonographer. Common bile duct: Diameter: 5 mm Liver: No focal lesion identified. Within normal limits in parenchymal echogenicity. Portal vein is patent on color Doppler imaging with normal direction of blood flow towards the liver. Other: None. IMPRESSION: Normal right upper quadrant ultrasound. Electronically Signed   By: Lajean Manes M.D.   On: 09/23/2019 15:50  [4 week]   Impression: Pleasant 79 yo male with PMH of chronic thrombocytopenia, chronic hyperbilirubinemia, HTN, hyperlipidemia, DM presenting to hospital due to generalized weakness. Diagnosed with sepsis secondary to UTI. GI consulted for elevated LFTS.  Abnormal LFTS: noted elevated Total bili dating back to 2018, not differentiated. 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 Current ruq u/s normal. Viral markers negative. In June AST/ALT  normal. No etoh history. Patient is asymptomatic. May have had bump in setting of acute infection. Would follow back to baseline. Differentiate the bilirubin.   Chronic thrombocytopenia: Dating back to 2018. ruq u/s with normal appearing liver. Spleen imaging not available.   Anemia: Hgb normal two weeks ago and back in 07/2019. Some decline this admission in setting of rehydration and likely in part dilutional. Patient reports black stool this admission, none at home. No report of melena per nursing staff. Last stool two days ago. BUN normal. Will continue to monitor.   Plan: 1. Recheck LFTs tomorrow along with CBC.  2. Check anemia labs. 3. Differentiate bilirubin.  4. Monitor for evidence of GI bleeding. 5. Consider further imaging of spleen given chronic thrombocytopenia.   We would like to thank you for the opportunity to participate in the care of [facility].  Laureen Ochs. Bernarda Caffey [facility] [phone] 8/12/202110:26 AM     LOS: 2 days

## 2019-09-26 ENCOUNTER — Inpatient Hospital Stay (HOSPITAL_COMMUNITY): Payer: Medicare HMO

## 2019-09-26 DIAGNOSIS — D500 Iron deficiency anemia secondary to blood loss (chronic): Secondary | ICD-10-CM

## 2019-09-26 DIAGNOSIS — R7401 Elevation of levels of liver transaminase levels: Secondary | ICD-10-CM

## 2019-09-26 DIAGNOSIS — R531 Weakness: Secondary | ICD-10-CM

## 2019-09-26 DIAGNOSIS — A419 Sepsis, unspecified organism: Principal | ICD-10-CM

## 2019-09-26 DIAGNOSIS — N390 Urinary tract infection, site not specified: Secondary | ICD-10-CM

## 2019-09-26 LAB — GLUCOSE, CAPILLARY
Glucose-Capillary: 129 mg/dL — ABNORMAL HIGH (ref 70–99)
Glucose-Capillary: 163 mg/dL — ABNORMAL HIGH (ref 70–99)
Glucose-Capillary: 130 mg/dL — ABNORMAL HIGH (ref 70–99)
Glucose-Capillary: 176 mg/dL — ABNORMAL HIGH (ref 70–99)

## 2019-09-26 LAB — CBC WITH DIFFERENTIAL/PLATELET
Abs Immature Granulocytes: 0.02 10*3/uL (ref 0.00–0.07)
Basophils Absolute: 0 10*3/uL (ref 0.0–0.1)
Basophils Relative: 1 %
Eosinophils Absolute: 0.1 10*3/uL (ref 0.0–0.5)
Eosinophils Relative: 1 %
HCT: 34.8 % — ABNORMAL LOW (ref 39.0–52.0)
Hemoglobin: 11.6 g/dL — ABNORMAL LOW (ref 13.0–17.0)
Immature Granulocytes: 1 %
Lymphocytes Relative: 16 %
Lymphs Abs: 0.7 10*3/uL (ref 0.7–4.0)
MCH: 30 pg (ref 26.0–34.0)
MCHC: 33.3 g/dL (ref 30.0–36.0)
MCV: 89.9 fL (ref 80.0–100.0)
Monocytes Absolute: 0.4 10*3/uL (ref 0.1–1.0)
Monocytes Relative: 10 %
Neutro Abs: 3 10*3/uL (ref 1.7–7.7)
Neutrophils Relative %: 71 %
Platelets: 97 10*3/uL — ABNORMAL LOW (ref 150–400)
RBC: 3.87 MIL/uL — ABNORMAL LOW (ref 4.22–5.81)
RDW: 13.4 % (ref 11.5–15.5)
WBC: 4.1 10*3/uL (ref 4.0–10.5)
nRBC: 0 % (ref 0.0–0.2)

## 2019-09-26 LAB — COMPREHENSIVE METABOLIC PANEL
ALT: 195 U/L — ABNORMAL HIGH (ref 0–44)
AST: 211 U/L — ABNORMAL HIGH (ref 15–41)
Albumin: 2.5 g/dL — ABNORMAL LOW (ref 3.5–5.0)
Alkaline Phosphatase: 66 U/L (ref 38–126)
Anion gap: 7 (ref 5–15)
BUN: 8 mg/dL (ref 8–23)
CO2: 22 mmol/L (ref 22–32)
Calcium: 7.8 mg/dL — ABNORMAL LOW (ref 8.9–10.3)
Chloride: 111 mmol/L (ref 98–111)
Creatinine, Ser: 0.81 mg/dL (ref 0.61–1.24)
GFR calc Af Amer: >60 mL/min (ref >60)
GFR calc non Af Amer: >60 mL/min (ref >60)
Glucose, Bld: 127 mg/dL — ABNORMAL HIGH (ref 70–99)
Potassium: 3.3 mmol/L — ABNORMAL LOW (ref 3.5–5.1)
Sodium: 140 mmol/L (ref 135–145)
Total Bilirubin: 1.1 mg/dL (ref 0.3–1.2)
Total Protein: 5.2 g/dL — ABNORMAL LOW (ref 6.5–8.1)

## 2019-09-26 LAB — MAGNESIUM: Magnesium: 2 mg/dL (ref 1.7–2.4)

## 2019-09-26 LAB — FOLATE: Folate: 23.3 ng/mL (ref >5.9)

## 2019-09-26 LAB — VITAMIN B12: Vitamin B-12: 141 pg/mL — ABNORMAL LOW (ref 180–914)

## 2019-09-26 MED ORDER — POTASSIUM CHLORIDE CRYS ER 20 MEQ PO TBCR
40.0000 meq | EXTENDED_RELEASE_TABLET | ORAL | Status: AC
  Administered 2019-09-26: 40 meq via ORAL
  Filled 2019-09-26: qty 2

## 2019-09-26 NOTE — Progress Notes (Addendum)
Agree with the below findings as written. The patient was presented to me by the APP (Advanced Practice Provider) and I have also seen and examined the patient independently. Please see my key portion of the encounter as documented.  Patient's liver function tests continue to improve. RUQ US unremarkable, further imaging of the spleen pending. Needs follow up with [place] in the GI clinic after discharge. Thank you for letting us take part in this patient's care.    Subjective: Feels good this morning. Tolerated breakfast well. No abdominal pain, N/V. Had a bowel movement this morning without hematochezia or melena. Denies any previous or current jaundice, scleral icterus, choluria. No other GI complaints. Is asking when he'll be able to go home.  Objective: Vital signs in last 24 hours: Temp:  [98 F (36.7 C)-98.5 F (36.9 C)] 98.5 F (36.9 C) (08/13 0436) Pulse Rate:  [75-88] 75 (08/13 0436) Resp:  [16] 16 (08/13 0436) BP: (117-123)/(58-108) 117/58 (08/13 0436) SpO2:  [94 %-99 %] 95 % (08/13 0436) Last BM Date: 09/23/19 General:   Alert and oriented, pleasant Head:  Normocephalic and atraumatic. Eyes:  No icterus, sclera clear. Conjuctiva pink.  Heart:  S1, S2 present, no murmurs noted.  Lungs: Clear to auscultation bilaterally, without wheezing, rales, or rhonchi.  Abdomen:  Bowel sounds present, soft, non-tender, non-distended. Neither enlarged liver or enlarged spleen appreciated. No rebound or guarding. No masses appreciated  Msk:  Symmetrical without gross deformities. Pulses:  Normal bilateral DP pulses noted. Extremities:  Without clubbing or edema. Neurologic:  Alert and  oriented x4;  grossly normal neurologically. Psych:  Alert and cooperative. Normal mood and affect.  Intake/Output from previous day: 08/12 0701 - 08/13 0700 In: 600 [P.O.:600] Out: 1400 [Urine:1400] Intake/Output this shift: No intake/output data recorded.  Lab Results: Recent Labs    09/24/19 0429  09/24/19 0429 09/25/19 0606 09/25/19 1448 09/26/19 0604  WBC 3.9*  --  5.3  --  4.1  HGB 11.9*  --  11.8*  --  11.6*  HCT 34.9*  --  35.2*  --  34.8*  PLT 104*   < > 101* 106* 97*   < > = values in this interval not displayed.   BMET Recent Labs    09/24/19 0429 09/25/19 0606 09/26/19 0604  NA 136 140 140  K 3.5 3.4* 3.3*  CL 104 109 111  CO2 22 21* 22  GLUCOSE 149* 139* 127*  BUN 14 9 8   CREATININE 0.94 0.81 0.81  CALCIUM 8.0* 7.9* 7.8*   LFT Recent Labs    09/24/19 0429 09/25/19 0606 09/26/19 0604  PROT 5.6* 5.4* 5.2*  ALBUMIN 2.7* 2.5* 2.5*  AST 222* 240* 211*  ALT 136* 181* 195*  ALKPHOS 61 62 66  BILITOT 3.7* 1.6* 1.1  BILIDIR  --  0.8*  --    PT/INR Recent Labs    09/23/19 1344 09/25/19 1448  LABPROT 13.7 14.2  13.8  INR 1.1 1.1  1.1   Hepatitis Panel Recent Labs    09/23/19 1344  HEPBSAG NON REACTIVE  HCVAB NON REACTIVE  HEPAIGM NON REACTIVE  HEPBIGM NON REACTIVE     Studies/Results: No results found.  Assessment: Pleasant 79 yo male with PMH of chronic thrombocytopenia, chronic hyperbilirubinemia, HTN, hyperlipidemia, DM presenting to hospital due to generalized weakness. Diagnosed with sepsis secondary to UTI. GI consulted for elevated LFTs.   Abnormal LFTs- noted elevated Total bili dating back to 2018, previously not differentiated. 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 Admission ruq  u/s normal 09/23/19. Viral markers negative. In June AST/ALT normal. No etoh history. Patient is asymptomatic. May have had bump in LFTs in setting of acute infection. Direct bilirubin differentiated yesterday and elevated at 0.8. However, today Bilirubin is back to normal at 1.1. Transaminases improved compared to yesterday but still elevated. No symptoms of liver disease previously or currently.   Chronic thrombocytopenia- Dating back to 2018. ruq u/s with normal appearing liver. Spleen imaging not available currently. Splenomegaly not appreciated on  exam.   Anemia- Hgb normal two weeks ago and back in 07/2019. Some decline this admission in setting of rehydration and likely in part dilutional. Patient reports black stool this admission, none at home. No report of melena per nursing staff. BUN normal. Hgb remains stable today at 11.6; doubt need for any endoscopic evaluation this admission unless there's an acute change. Denies any hematochezia or melena today.   Plan: Continue to trend LFTs Follow hgb for any declines Monitor for any obvious GI bleed Abdominal U/S for spleen Plan outpatient follow-up to complete liver workup for chronic LFT elevation Supportive measures   Thank you for allowing [place] to participate in the care of Kyle Molina  Kyle Dust, DNP, AGNP-C Adult & Gerontological Nurse Practitioner [facility]    LOS: 3 days    09/26/2019, 8:42 AM

## 2019-09-26 NOTE — Progress Notes (Signed)
[facility]                                                                              Patient Demographics  Kyle Molina, is a 79 y.o. male, DOB - 1940-09-05, WFU:[id number]  Admit date - 09/23/2019   Admitting Physician Reubin Milan, MD  Outpatient Primary MD for the patient is Aletha Halim., PA-C  Outpatient specialists:   LOS - 3  days   Medical records reviewed and are as summarized below:    Chief Complaint  Patient presents with  . Weakness       Brief summary   Patient is an 79 year old Caucasian male with past medical history significant for type 2 diabetes mellitus, hyperlipidemia and hypertension.  Patient was admitted with 1 week history of generalized weakness.  Apparently, patient is a poor historian.  On presentation to the hospital, patient was febrile, tachycardic and needed volume resuscitation.  Patient is currently on management for sepsis secondary to UTI.  09/26/2019: Patient seen alongside patient's wife.  Significant part of the history came from the patient's wife.  According to the patient's wife, patient is not back to baseline.  Patient remains weak.  Patient's wife is not keen on patient being discharged back home today due to patient's current clinical status.  Patient will require further inpatient management to be clinically optimized prior to discharge.  If discharged brother Bonney Roussel, patient is at risk of deterioration at home.  We will keep patient inpatient for now.   Assessment & Plan    Principal Problem: Sepsis secondary to UTI [facility]): -Patient met sepsis criteria at the time of admission with fevers, tachycardia, mild DKA, transaminitis, likely secondary to UTI.  UA positive for UTI -Urine culture grew E. coli.  -Final blood culture still pending. -Continue IV Rocephin.  Hypertension: -BP stable.   -Continue to monitor closely.    Hyperlipidemia: -Hold statin secondary to elevated LFTs  Type 2  diabetes mellitus ([place]): -Continue sliding scale insulin, hold Metformin  Hypokalemia: -We will replete (K-Dur 40 M EQ p.o. every 4 hours x2 doses). -Place patient on telemetry monitoring -Check magnesium level  Cholestatic hepatitis, transaminitis -GI input is appreciated. -Follow-up with GI team on discharge. -On chart review, shows chronic history of elevated total bilirubin however transaminases has been normal, history of chronic thrombocytopenia.  Alkaline phosphatase normal. -Abdominal ultrasound did not show any CBD dilation, normal liver parenchyma, portal vein patent.  Continue to hold statin -Viral hepatitis negative.  No new medications  Chronic thrombocytopenia (HCC) -Chronic. -Stable. -Continue to monitor.   -Low threshold to refer patient to hematology on outpatient basis.  Hemoglobin is also low.  WBC is low normal.  Chronic normocytic anemia Panel showed ferritin 364, iron stores low 20, percent saturation 10 -Follow FOBT to rule out any chronic blood loss 09/26/2019: Check B12 folate level.   Code Status: Full CODE STATUS DVT Prophylaxis:   SCD's Family Communication: Discussed all imaging results, lab results, explained to the patient    Disposition Plan:     Status is: Inpatient  Remains inpatient appropriate because:Inpatient level of care appropriate due to severity of  illness.  Patient is not back to baseline.  Patient has significant electrolyte abnormality.  Will replete potassium.  Will check magnesium.  Will place patient on telemetry.  We will also check vitamin B12 and folate, consider normal MCV with documented low iron.  According to patient's wife, patient is now back to baseline.  Patient's wife does not feel that she can take patient home today.   Dispo: The patient is from: Home              Anticipated d/c is to: Home              Anticipated d/c date is: 1 day              Patient currently is not medically stable to d/c.  Pending GI  work-up      Time Spent in minutes   35 min  Procedures:  Abdominal ultrasound  Consultants:   Gastroenterology  Antimicrobials:   Anti-infectives (From admission, onward)   Start     Dose/Rate Route Frequency Ordered Stop   09/24/19 2200  cefTRIAXone (ROCEPHIN) 1 g in sodium chloride 0.9 % 100 mL IVPB     Discontinue     1 g 200 mL/hr over 30 Minutes Intravenous Every 24 hours 09/23/19 2346     09/23/19 2315  cefTRIAXone (ROCEPHIN) 1 g in sodium chloride 0.9 % 100 mL IVPB        1 g 200 mL/hr over 30 Minutes Intravenous  Once 09/23/19 2309 09/24/19 0028         Medications  Scheduled Meds: . aspirin EC  81 mg Oral Daily  . diazepam  2.5 mg Oral BID  . doxazosin  4 mg Oral QPM  . insulin aspart  0-9 Units Subcutaneous TID WC   Continuous Infusions: . sodium chloride 75 mL/hr at 09/25/19 1547  . cefTRIAXone (ROCEPHIN)  IV 1 g (09/25/19 2130)   PRN Meds:.ondansetron **OR** ondansetron (ZOFRAN) IV      Subjective:   Kyle Molina was seen and examined today.  Per patient, no acute complaints, no abdominal pain, nausea or vomiting.  No fevers.  No rash.  Patient denies dizziness, chest pain, shortness of breath, new weakness, numbess, tingling. No acute events overnight.    Objective:   Vitals:   09/25/19 1951 09/25/19 2010 09/26/19 0436 09/26/19 1329  BP:  (!) 123/108 (!) 117/58 102/62  Pulse:  88 75 82  Resp:  _0 Temp:  98 F (36.7 C) 98.5 F (36.9 C) 99.1 F (37.3 C)  TempSrc:  Oral Oral   SpO2: 94% 99% 95% 98%  Weight:      Height:        Intake/Output Summary (Last 24 hours) at 09/26/2019 1653 Last data filed at 09/25/2019 2000 Gross per 24 hour  Intake --  Output 200 ml  Net -200 ml     Wt Readings from Last 3 Encounters:  09/24/19 75.7 kg  09/09/19 81.6 kg  10/24/16 81.5 kg     Exam  General: Alert and oriented x 3, NAD  Cardiovascular: S1 S2 auscultated, no murmurs, RRR  Respiratory: Clear to auscultation  bilaterally, no wheezing, rales or rhonchi  Gastrointestinal: Soft, nontender, nondistended, + bowel sounds  Ext: no pedal edema bilaterally  Neuro:  Strength 5/5 upper and lower extremities bilaterally  Musculoskeletal: No digital cyanosis, clubbing  Skin: No rashes  Psych: Normal affect and demeanor, alert and oriented x3    Data Reviewed:  I have personally reviewed following labs and imaging studies  Micro Results Recent Results (from the past 240 hour(s))  Urine culture     Status: Abnormal (Preliminary result)   Collection Time: 09/23/19  1:11 PM   Specimen: In/Out Cath Urine  Result Value Ref Range Status   Specimen Description IN/OUT CATH URINE  Final   Special Requests NONE  Final   Culture (A)  Final    >=100,000 COLONIES/mL ESCHERICHIA COLI CULTURE REINCUBATED FOR BETTER GROWTH SUSCEPTIBILITIES TO FOLLOW    Report Status PENDING  Incomplete   Organism ID, Bacteria ESCHERICHIA COLI (A)  Final      Susceptibility   Escherichia coli - MIC*    AMPICILLIN <=2 SENSITIVE Sensitive     CEFAZOLIN <=4 SENSITIVE Sensitive     CEFTRIAXONE <=0.25 SENSITIVE Sensitive     CIPROFLOXACIN <=0.25 SENSITIVE Sensitive     GENTAMICIN <=1 SENSITIVE Sensitive     IMIPENEM <=0.25 SENSITIVE Sensitive     NITROFURANTOIN <=16 SENSITIVE Sensitive     TRIMETH/SULFA <=20 SENSITIVE Sensitive     AMPICILLIN/SULBACTAM <=2 SENSITIVE Sensitive     PIP/TAZO Value in next row Sensitive      <=4 SENSITIVEPerformed at [facility], [address] [place], [place] [address]    * >=100,000 COLONIES/mL ESCHERICHIA COLI  Blood Culture (routine x 2)     Status: None (Preliminary result)   Collection Time: 09/23/19  1:44 PM   Specimen: Left Antecubital; Blood  Result Value Ref Range Status   Specimen Description LEFT ANTECUBITAL  Final   Special Requests   Final    BOTTLES DRAWN AEROBIC AND ANAEROBIC Blood Culture adequate volume   Culture   Final    NO GROWTH 3 DAYS Performed at [facility], [address] [place], [place] [address]    Report Status PENDING  Incomplete  Blood Culture (routine x 2)     Status: None (Preliminary result)   Collection Time: 09/23/19  1:45 PM   Specimen: Right Antecubital; Blood  Result Value Ref Range Status   Specimen Description RIGHT ANTECUBITAL  Final   Special Requests   Final    BOTTLES DRAWN AEROBIC AND ANAEROBIC Blood Culture adequate volume   Culture   Final    NO GROWTH 3 DAYS Performed at [facility], [address], [place], [place] [address]    Report Status PENDING  Incomplete  SARS Coronavirus 2 by RT PCR (hospital order, performed in [place] hospital lab) Nasopharyngeal Nasopharyngeal Swab     Status: None   Collection Time: 09/23/19  4:34 PM   Specimen: Nasopharyngeal Swab  Result Value Ref Range Status   SARS Coronavirus 2 NEGATIVE NEGATIVE Final    Comment: (NOTE) SARS-CoV-2 target nucleic acids are NOT DETECTED.  The SARS-CoV-2 RNA is generally detectable in upper and lower respiratory specimens during the acute phase of infection. The lowest concentration of SARS-CoV-2 viral copies this assay can detect is 250 copies / mL. A negative result does not preclude SARS-CoV-2 infection and should not be used as the sole basis for treatment or other patient management decisions.  A negative result may occur with improper specimen collection / handling, submission of specimen other than nasopharyngeal swab, presence of viral mutation(s) within the areas targeted by this assay, and inadequate number of viral copies (<250 copies / mL). A negative result must be combined with clinical observations, patient history, and epidemiological information.  Fact Sheet for Patients:   StrictlyIdeas.no  Fact Sheet for  Healthcare Providers: BankingDealers.co.za  This test is not yet approved or  cleared by the [place] and has been authorized for detection and/or  diagnosis of SARS-CoV-2 by FDA under an Emergency Use Authorization (EUA).  This EUA will remain in effect (meaning this test can be used) for the duration of the COVID-19 declaration under Section 564(b)(1) of the Act, 21 U.S.C. section 360bbb-3(b)(1), unless the authorization is terminated or revoked sooner.  Performed at [facility], [address], [place], [place] [address]     Radiology Reports DG Chest [place] 1 View  Result Date: 09/23/2019 CLINICAL DATA:  Generalized weakness for 1 week EXAM: PORTABLE CHEST 1 VIEW COMPARISON:  09/09/2019 FINDINGS: Cardiac shadow is within normal limits. Previously seen changes in the right mid lung are stable to mildly improved consistent with resolving infiltrate. No new rated is noted. IMPRESSION: Resolving infiltrate on the right. Electronically Signed   By: Inez Catalina M.D.   On: 09/23/2019 13:46   DG Chest Port 1 View  Result Date: 09/09/2019 CLINICAL DATA:  Chest pain EXAM: PORTABLE CHEST 1 VIEW COMPARISON:  None. FINDINGS: Cardiac shadow is within normal limits. The lungs are well aerated bilaterally. Some patchy airspace opacity is noted in the right mid lung consistent with early infiltrate. No effusion or pneumothorax is noted. No bony abnormality is seen. IMPRESSION: Changes consistent with early infiltrate in the right mid lung. Electronically Signed   By: Inez Catalina M.D.   On: 09/09/2019 01:59   US SPLEEN (ABDOMEN LIMITED)  Result Date: 09/26/2019 CLINICAL DATA:  Thrombocytopenia EXAM: ULTRASOUND ABDOMEN LIMITED COMPARISON:  Abdominal ultrasound 09/23/2019 FINDINGS: No mass identified within the spleen. The spleen is normal in size measuring 9.9 cm in length with a volume of 540.9 mL. IMPRESSION: Normal sonographic appearance of the spleen. Electronically Signed   By: Audie Pinto M.D.   On: 09/26/2019 11:58   US Abdomen Limited RUQ  Result Date: 09/23/2019 CLINICAL DATA:  Elevated liver function tests. EXAM: ULTRASOUND ABDOMEN  LIMITED RIGHT UPPER QUADRANT COMPARISON:  None. FINDINGS: Gallbladder: No gallstones or wall thickening visualized. No sonographic Murphy sign noted by sonographer. Common bile duct: Diameter: 5 mm Liver: No focal lesion identified. Within normal limits in parenchymal echogenicity. Portal vein is patent on color Doppler imaging with normal direction of blood flow towards the liver. Other: None. IMPRESSION: Normal right upper quadrant ultrasound. Electronically Signed   By: Lajean Manes M.D.   On: 09/23/2019 15:50    Lab Data:  CBC: Recent Labs  Lab 09/23/19 1344 09/24/19 0429 09/25/19 0606 09/25/19 1448 09/26/19 0604  WBC 5.2 3.9* 5.3  --  4.1  NEUTROABS 4.5 3.1 4.2  --  3.0  HGB 12.3* 11.9* 11.8*  --  11.6*  HCT 36.6* 34.9* 35.2*  --  34.8*  MCV 88.0 87.0 87.6  --  89.9  PLT 118* 104* 101* 106* 97*   Basic Metabolic Panel: Recent Labs  Lab 09/23/19 1344 09/24/19 0429 09/25/19 0606 09/26/19 0604  NA 133* 136 140 140  K 4.0 3.5 3.4* 3.3*  CL 102 104 109 111  CO2 21* 22 21* 22  GLUCOSE 182* 149* 139* 127*  BUN _0 CREATININE 1.21 0.94 0.81 0.81  CALCIUM 8.2* 8.0* 7.9* 7.8*   GFR: Estimated Creatinine Clearance: 69.1 mL/min (by C-G formula based on SCr of 0.81 mg/dL). Liver Function Tests: Recent Labs  Lab 09/23/19 1344 09/24/19 0429 09/25/19 0606 09/26/19 0604  AST 261* 222* 240* 211*  ALT  132* 136* 181* 195*  ALKPHOS 69 61 62 66  BILITOT 3.5* 3.7* 1.6* 1.1  PROT 6.2* 5.6* 5.4* 5.2*  ALBUMIN 3.1* 2.7* 2.5* 2.5*   No results for input(s): LIPASE, AMYLASE in the last 168 hours. No results for input(s): AMMONIA in the last 168 hours. Coagulation Profile: Recent Labs  Lab 09/23/19 1344 09/25/19 1448  INR 1.1 1.1  1.1   Cardiac Enzymes: No results for input(s): CKTOTAL, CKMB, CKMBINDEX, TROPONINI in the last 168 hours. BNP (last 3 results) No results for input(s): PROBNP in the last 8760 hours. HbA1C: No results for input(s): HGBA1C in the last 72  hours. CBG: Recent Labs  Lab 09/25/19 0836 09/25/19 1205 09/25/19 1639 09/26/19 0811 09/26/19 1229  GLUCAP 139* 160* 119* 129* 163*   Lipid Profile: No results for input(s): CHOL, HDL, LDLCALC, TRIG, CHOLHDL, LDLDIRECT in the last 72 hours. Thyroid Function Tests: No results for input(s): TSH, T4TOTAL, FREET4, T3FREE, THYROIDAB in the last 72 hours. Anemia Panel: No results for input(s): VITAMINB12, FOLATE, FERRITIN, TIBC, IRON, RETICCTPCT in the last 72 hours. Urine analysis:    Component Value Date/Time   COLORURINE AMBER (A) 09/23/2019 1311   APPEARANCEUR CLEAR 09/23/2019 1311   LABSPEC 1.011 09/23/2019 1311   PHURINE 6.0 09/23/2019 1311   GLUCOSEU NEGATIVE 09/23/2019 1311   HGBUR MODERATE (A) 09/23/2019 1311   BILIRUBINUR NEGATIVE 09/23/2019 1311   KETONESUR NEGATIVE 09/23/2019 1311   PROTEINUR NEGATIVE 09/23/2019 1311   NITRITE NEGATIVE 09/23/2019 1311   LEUKOCYTESUR LARGE (A) 09/23/2019 1311     Bonnell Public M.D. [facility] 09/26/2019, 4:53 PM   Call night coverage person covering after 7pm

## 2019-09-26 NOTE — TOC Initial Note (Signed)
Transition of Care [facility]) - Initial/Assessment Note   Patient Details  Name: Kyle Molina MRN: [id number] Date of Birth: April 25, 1940  Transition of Care [facility]) CM/SW Contact:    Ewing Schlein, LCSW Phone Number: 09/26/2019, 2:46 PM  Clinical Narrative: Patient is an 79 year old male who was admitted for sepsis secondary to UTI, hypertension, hyperlipidemia, type 2 diabetes mellitus, cholestatic hepatitis, thrombocytopenia, anemia, generalized weakness, hyperbilirubinemia, and transaminitis. PT evaluation recommended HHPT. CSW spoke with patient and his wife, Ameen Mostafa, to discuss [facility] referral. Patient and wife agreeable. CSW made referral to [facility] for HHPT. Wife updated.  Expected Discharge Plan: Home w Home Health Services Barriers to Discharge: Continued Medical Work up  Patient Goals and CMS Choice Patient states their goals for this hospitalization and ongoing recovery are:: Discharge home CMS Medicare.gov Compare Post Acute Care list provided to:: Patient Choice offered to / list presented to : Patient  Expected Discharge Plan and Services Expected Discharge Plan: Home w Home Health Services In-house Referral: Clinical Social Work Discharge Planning Services: NA Post Acute Care Choice: Home Health Living arrangements for the past 2 months: Mobile Home              DME Arranged: N/A DME Agency: NA HH Arranged: PT HH Agency: [facility] Date HH Agency Contacted: 09/26/19 Time HH Agency Contacted: 1304 Representative spoke with at [facility] Agency: Tim  Prior Living Arrangements/Services Living arrangements for the past 2 months: Mobile Home Lives with:: Spouse Patient language and need for interpreter reviewed:: Yes Do you feel safe going back to the place where you live?: Yes      Need for Family Participation in Patient Care: No (Comment) Care giver support system in place?: Yes (comment) Criminal Activity/Legal Involvement Pertinent  to Current Situation/Hospitalization: No - Comment as needed  Activities of Daily Living Home Assistive Devices/Equipment: Cane (specify quad or straight) ADL Screening (condition at time of admission) Patient's cognitive ability adequate to safely complete daily activities?: Yes Is the patient deaf or have difficulty hearing?: No Does the patient have difficulty seeing, even when wearing glasses/contacts?: No Does the patient have difficulty concentrating, remembering, or making decisions?: No Patient able to express need for assistance with ADLs?: Yes Does the patient have difficulty dressing or bathing?: No Independently performs ADLs?: Yes (appropriate for developmental age) Does the patient have difficulty walking or climbing stairs?: Yes Weakness of Legs: Both Weakness of Arms/Hands: None  Permission Sought/Granted Permission sought to share information with : Facility Industrial/product designer granted to share information with : Yes, Verbal Permission Granted Permission granted to share info w AGENCY: [facility]  Emotional Assessment Appearance:: Appears stated age Attitude/Demeanor/Rapport: Engaged Affect (typically observed): Accepting Orientation: : Oriented to Self, Oriented to Place, Oriented to  Time, Oriented to Situation Alcohol / Substance Use: Not Applicable Psych Involvement: No (comment)  Admission diagnosis:  Acute cystitis without hematuria [N30.00] Generalized weakness [R53.1] Sepsis secondary to UTI (HCC) [A41.9, N39.0] LFT elevation [R79.89] Patient Active Problem List   Diagnosis Date Noted  . Generalized weakness   . Hyperbilirubinemia   . Transaminitis   . LFT elevation   . Thrombocytopenia (HCC)   . Anemia   . Sepsis secondary to UTI (HCC) 09/23/2019  . 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  . Cellulitis 10/24/2016  . Hypertension   . Hyperlipidemia   . Type 2 diabetes mellitus (HCC)   . Cellulitis of hand, left    PCP:  Richmond Campbell.,  PA-C Pharmacy:  [facility], [place] [address] Phone: [phone] Fax: [phone]  Readmission Risk Interventions No flowsheet data found.

## 2019-09-26 NOTE — Care Management Important Message (Signed)
Important Message  Patient Details  Name: Kyle Molina MRN: [id number] Date of Birth: 1940-02-26   Medicare Important Message Given:  Yes     Ewing Schlein, LCSW 09/26/2019, 12:19 PM

## 2019-09-27 LAB — RENAL FUNCTION PANEL
Albumin: 2.5 g/dL — ABNORMAL LOW (ref 3.5–5.0)
Anion gap: 9 (ref 5–15)
BUN: 10 mg/dL (ref 8–23)
CO2: 21 mmol/L — ABNORMAL LOW (ref 22–32)
Calcium: 8 mg/dL — ABNORMAL LOW (ref 8.9–10.3)
Chloride: 111 mmol/L (ref 98–111)
Creatinine, Ser: 0.77 mg/dL (ref 0.61–1.24)
GFR calc Af Amer: >60 mL/min (ref >60)
GFR calc non Af Amer: >60 mL/min (ref >60)
Glucose, Bld: 140 mg/dL — ABNORMAL HIGH (ref 70–99)
Phosphorus: 2.1 mg/dL — ABNORMAL LOW (ref 2.5–4.6)
Potassium: 3.5 mmol/L (ref 3.5–5.1)
Sodium: 141 mmol/L (ref 135–145)

## 2019-09-27 LAB — HEPATIC FUNCTION PANEL
ALT: 193 U/L — ABNORMAL HIGH (ref 0–44)
AST: 168 U/L — ABNORMAL HIGH (ref 15–41)
Albumin: 2.5 g/dL — ABNORMAL LOW (ref 3.5–5.0)
Alkaline Phosphatase: 66 U/L (ref 38–126)
Bilirubin, Direct: 0.4 mg/dL — ABNORMAL HIGH (ref 0.0–0.2)
Indirect Bilirubin: 0.6 mg/dL (ref 0.3–0.9)
Total Bilirubin: 1 mg/dL (ref 0.3–1.2)
Total Protein: 5.2 g/dL — ABNORMAL LOW (ref 6.5–8.1)

## 2019-09-27 LAB — CBC WITH DIFFERENTIAL/PLATELET
Abs Immature Granulocytes: 0.02 10*3/uL (ref 0.00–0.07)
Basophils Absolute: 0 10*3/uL (ref 0.0–0.1)
Basophils Relative: 1 %
Eosinophils Absolute: 0.1 10*3/uL (ref 0.0–0.5)
Eosinophils Relative: 3 %
HCT: 34.9 % — ABNORMAL LOW (ref 39.0–52.0)
Hemoglobin: 11.5 g/dL — ABNORMAL LOW (ref 13.0–17.0)
Immature Granulocytes: 0 %
Lymphocytes Relative: 17 %
Lymphs Abs: 0.8 10*3/uL (ref 0.7–4.0)
MCH: 29 pg (ref 26.0–34.0)
MCHC: 33 g/dL (ref 30.0–36.0)
MCV: 87.9 fL (ref 80.0–100.0)
Monocytes Absolute: 0.4 10*3/uL (ref 0.1–1.0)
Monocytes Relative: 9 %
Neutro Abs: 3.5 10*3/uL (ref 1.7–7.7)
Neutrophils Relative %: 70 %
Platelets: 114 10*3/uL — ABNORMAL LOW (ref 150–400)
RBC: 3.97 MIL/uL — ABNORMAL LOW (ref 4.22–5.81)
RDW: 13.4 % (ref 11.5–15.5)
WBC: 5 10*3/uL (ref 4.0–10.5)
nRBC: 0 % (ref 0.0–0.2)

## 2019-09-27 LAB — URINE CULTURE: Culture: >=100000 — AB

## 2019-09-27 LAB — MAGNESIUM: Magnesium: 2 mg/dL (ref 1.7–2.4)

## 2019-09-27 LAB — GLUCOSE, CAPILLARY
Glucose-Capillary: 135 mg/dL — ABNORMAL HIGH (ref 70–99)
Glucose-Capillary: 167 mg/dL — ABNORMAL HIGH (ref 70–99)

## 2019-09-27 MED ORDER — VITAMIN B-12 1000 MCG PO TABS
1000.0000 ug | ORAL_TABLET | Freq: Every day | ORAL | 2 refills | Status: AC
Start: 2019-09-27 — End: ?

## 2019-09-27 MED ORDER — CEPHALEXIN 250 MG PO CAPS: 250.0000 mg | ORAL_CAPSULE | Freq: Four times a day (QID) | ORAL | 0 refills | Status: AC

## 2019-09-27 NOTE — Discharge Summary (Signed)
Physician Discharge Summary  Patient ID: Kyle Molina MRN: [id number] DOB/AGE: 1940/05/19 79 y.o.  Admit date: 09/23/2019 Discharge date: 09/27/2019  Admission Diagnoses:  Discharge Diagnoses:  Principal Problem:   Sepsis secondary to UTI [facility]) Active Problems:   Hypertension   Hyperlipidemia   Type 2 diabetes mellitus (HCC)   Cholestatic hepatitis   LFT elevation   Thrombocytopenia (HCC)   Anemia   Generalized weakness   Hyperbilirubinemia   Transaminitis Vitamin B12 deficiency  Discharged Condition: Stable  Hospital Course: Patient is an 79 year old Caucasian male with past medical history significant fortype 2 diabetesmellitus, hyperlipidemiaand hypertension. Patient was admitted with 1 week history of generalized weakness. On presentation to the hospital, patient was febrile, tachycardic and needed volume resuscitation.  Work-up done revealed sepsis/UTI secondary to E. coli.  Patient was managed with IV ceftriaxone.  Patient will be discharged back home on oral Keflex.  Patient will follow with the primary care provider within 1 week of discharge.    Sepsis secondary to UTI [facility]): -Patient was admitted with fever, tachycardia, mild DKA, transaminitis, likely secondary to UTI.  -Urine culturegrew E. coli. -Final blood culture are still pending. -Patient was treated with IV Rocephin, and will be discharged back home on Keflex.  Hypertension: -BPstable. -Continue to monitor closely.  Hyperlipidemia: -Hold statin secondary to elevated LFTs  Type 2 diabetes mellitus (HCC): -Continue sliding scale insulin, hold Metformin  Hypokalemia: -Electrolytes were monitored and replete during the hospital stay -Potassium prior to discharge was 3.5. -Magnesium level checked prior to discharge was 2  Cholestatic hepatitis, transaminitis: -GI input is appreciated. -Follow-up with GI team on discharge. -Patient has chronically elevated total bilirubin,  however, transaminases had been normal. -Chronic thrombocytopenia.  -Abdominal ultrasound did not show any CBD dilation, normal liver parenchyma, portal vein patent.  -Statin is on hold. -Viral hepatitis negative.  -No new medications  Chronic thrombocytopenia (HCC): -Chronic. -Stable. -Continue to monitor. -Low threshold to refer patient to hematology on outpatient basis. Hemoglobin is also low. WBC is low normal.  Chronic normocytic anemia: -Iron panel revealed ferritin of 364, iron stores low 20, percent saturation 10 -Vitamin B12 was 141 -Folate was 23.3.  B12 deficiency: -Start oral Vitamin B12. -May need to change to IM Vitamin B12 if B12 is not adequately replaced.  Will defer the decision to change from oral to IM to the primary care provider.    Consults: None  Significant Diagnostic Studies: labs: Urine culture grew E. coli.  Discharge Exam: Blood pressure 116/61, pulse 85, temperature 97.6 F (36.4 C), temperature source Oral, resp. rate 18, height 5\' 5"  (1.651 m), weight 75.7 kg, SpO2 100 %.   Disposition: Discharge disposition: 01-Home or Self Care   Discharge Instructions    Diet - low sodium heart healthy   Complete by: As directed    Increase activity slowly   Complete by: As directed      Allergies as of 09/27/2019      Reactions   Sulfa Antibiotics Itching      Medication List    STOP taking these medications   metoprolol succinate 100 MG 24 hr tablet Commonly known as: TOPROL-XL   niacin 500 MG CR tablet Commonly known as: NIASPAN     TAKE these medications   aspirin 81 MG tablet Take 81 mg by mouth daily.   atorvastatin 20 MG tablet Commonly known as: LIPITOR Take 20 mg by mouth at bedtime.   cephALEXin 250 MG capsule Commonly known as: KEFLEX Take 1 capsule (250  mg total) by mouth 4 (four) times daily for 5 days.   diazepam 5 MG tablet Commonly known as: VALIUM Take 2.5 mg by mouth 2 (two) times daily.   doxazosin  4 MG tablet Commonly known as: CARDURA Take 4 mg by mouth every evening.   metFORMIN 500 MG 24 hr tablet Commonly known as: GLUCOPHAGE-XR Take 1 tablet by mouth daily.   oxybutynin 10 MG 24 hr tablet Commonly known as: DITROPAN-XL Take 10 mg by mouth at bedtime.   vitamin B-12 1000 MCG tablet Commonly known as: CYANOCOBALAMIN Take 1 tablet (1,000 mcg total) by mouth daily.        SignedBarnetta Chapel 09/27/2019, 3:20 PM

## 2019-09-27 NOTE — Progress Notes (Signed)
Nsg Discharge Note  Admit Date:  09/23/2019 Discharge date: 09/27/2019   Kyle Molina to be D/C'd Kyle Molina spouse per MD order.  Patient's spouse able to verbalize understanding.  Discharge Medication: Allergies as of 09/27/2019      Reactions   Sulfa Antibiotics Itching      Medication List    STOP taking these medications   metoprolol succinate 100 MG 24 hr tablet Commonly known as: TOPROL-XL   niacin 500 MG CR tablet Commonly known as: NIASPAN     TAKE these medications   aspirin 81 MG tablet Take 81 mg by mouth daily.   atorvastatin 20 MG tablet Commonly known as: LIPITOR Take 20 mg by mouth at bedtime.   cephALEXin 250 MG capsule Commonly known as: KEFLEX Take 1 capsule (250 mg total) by mouth 4 (four) times daily for 5 days.   diazepam 5 MG tablet Commonly known as: VALIUM Take 2.5 mg by mouth 2 (two) times daily.   doxazosin 4 MG tablet Commonly known as: CARDURA Take 4 mg by mouth every evening.   metFORMIN 500 MG 24 hr tablet Commonly known as: GLUCOPHAGE-XR Take 1 tablet by mouth daily.   oxybutynin 10 MG 24 hr tablet Commonly known as: DITROPAN-XL Take 10 mg by mouth at bedtime.   vitamin B-12 1000 MCG tablet Commonly known as: CYANOCOBALAMIN Take 1 tablet (1,000 mcg total) by mouth daily.       Discharge Assessment: Vitals:   09/27/19 0500 09/27/19 1314  BP: 137/71 116/61  Pulse: 89 85  Resp: 18 18  Temp: 98.2 F (36.8 C) 97.6 F (36.4 C)  SpO2: 98% 100%   Skin clean, dry and intact without evidence of skin break down, no evidence of skin tears noted. IV catheter discontinued intact. Site without signs and symptoms of complications - no redness or edema noted at insertion site, patient denies c/o pain - only slight tenderness at site.  Dressing with slight pressure applied.  D/c Instructions-Education: Discharge instructions given to patient's spouse Malik Paar  with verbalized understanding. D/c education completed  with patient's spouse  including follow up instructions, medication list, d/c activities limitations if indicated, with other d/c instructions as indicated by MD - patient able to verbalize understanding, all questions fully answered. Patient instructed to return to ED, call 911, or call MD for any changes in condition.  Patient escorted via WC, and D/C home via private auto.  Jethro Poling, RN 09/27/2019 4:10 PM

## 2019-09-27 NOTE — Discharge Summary (Deleted)
Physician Discharge Summary  Patient ID: Kyle Molina MRN: [id number] DOB/AGE: 04/12/1940 79 y.o.  Admit date: 09/23/2019 Discharge date: 09/27/2019  Admission Diagnoses:  Discharge Diagnoses:  Principal Problem:   Sepsis secondary to UTI [facility]) Active Problems:   Hypertension   Hyperlipidemia   Type 2 diabetes mellitus (HCC)   Cholestatic hepatitis   LFT elevation   Thrombocytopenia (HCC)   Anemia   Generalized weakness   Hyperbilirubinemia   Transaminitis   Discharged Condition: stable  Hospital Course: Patient is an 79 year old Caucasian male with past medical history significant for type 2 diabetes mellitus, hyperlipidemia and hypertension.  Patient was admitted with 1 week history of generalized weakness.  On presentation to the hospital, patient was febrile, tachycardic and needed volume resuscitation.  Work-up done revealed sepsis/UTI secondary to E. coli.  Patient was managed with IV ceftriaxone.  Patient will be discharged back home on oral Keflex.  Patient will follow with the primary care provider within 1 week of discharge.    Sepsis secondary to UTI [facility]): -Patient was admitted with fever, tachycardia, mild DKA, transaminitis, likely secondary to UTI.   -Urine culture grew E. coli.  -Final blood culture are still pending. -Patient was treated with IV Rocephin, and will be discharged back home on Keflex.  Hypertension: -BP stable.   -Continue to monitor closely.    Hyperlipidemia: -Hold statin secondary to elevated LFTs  Type 2 diabetes mellitus (HCC): -Continue sliding scale insulin, hold Metformin  Hypokalemia: -Electrolytes were monitored and replete during the hospital stay -Potassium prior to discharge was 3.5. -Magnesium level checked prior to discharge was 2  Cholestatic hepatitis, transaminitis: -GI input is appreciated. -Follow-up with GI team on discharge. -Patient has chronically elevated total bilirubin, however, transaminases had  been normal. -Chronic thrombocytopenia.  -Abdominal ultrasound did not show any CBD dilation, normal liver parenchyma, portal vein patent.   -Statin is on hold. -Viral hepatitis negative.   -No new medications  Chronic thrombocytopenia (HCC): -Chronic. -Stable. -Continue to monitor.   -Low threshold to refer patient to hematology on outpatient basis.  Hemoglobin is also low.  WBC is low normal.  Chronic normocytic anemia: -Iron panel revealed ferritin of 364, iron stores low 20, percent saturation 10 -Vitamin B12 was 141 -Folate was 23.3.  B12 deficiency: -Start oral Vitamin B12. -May need to change to IM Vitamin B12 if B12 is not adequately replaced.  Will defer the decision to change from oral to IM to the primary care provider.    Consults: None  Significant Diagnostic Studies: labs: Urine culture grew E. coli.  Discharge Exam: Blood pressure 116/61, pulse 85, temperature 97.6 F (36.4 C), temperature source Oral, resp. rate 18, height 5\' 5"  (1.651 m), weight 75.7 kg, SpO2 100 %.   Disposition: Discharge disposition: 01-Home or Self Care   Discharge Instructions    Diet - low sodium heart healthy   Complete by: As directed    Increase activity slowly   Complete by: As directed      Allergies as of 09/27/2019      Reactions   Sulfa Antibiotics Itching      Medication List    STOP taking these medications   metoprolol succinate 100 MG 24 hr tablet Commonly known as: TOPROL-XL   niacin 500 MG CR tablet Commonly known as: NIASPAN     TAKE these medications   aspirin 81 MG tablet Take 81 mg by mouth daily.   atorvastatin 20 MG tablet Commonly known as: LIPITOR Take 20 mg by  mouth at bedtime.   cephALEXin 250 MG capsule Commonly known as: KEFLEX Take 1 capsule (250 mg total) by mouth 4 (four) times daily for 5 days.   diazepam 5 MG tablet Commonly known as: VALIUM Take 2.5 mg by mouth 2 (two) times daily.   doxazosin 4 MG tablet Commonly known  as: CARDURA Take 4 mg by mouth every evening.   metFORMIN 500 MG 24 hr tablet Commonly known as: GLUCOPHAGE-XR Take 1 tablet by mouth daily.   oxybutynin 10 MG 24 hr tablet Commonly known as: DITROPAN-XL Take 10 mg by mouth at bedtime.        SignedBarnetta Chapel 09/27/2019, 3:04 PM

## 2019-09-28 LAB — CULTURE, BLOOD (ROUTINE X 2)
Culture: NO GROWTH
Culture: NO GROWTH
Special Requests: ADEQUATE
Special Requests: ADEQUATE

## 2020-11-30 ENCOUNTER — Encounter (HOSPITAL_COMMUNITY)
Admission: RE | Admit: 2020-11-30 | Discharge: 2020-11-30 | Disposition: A | Discharge status: Home / Self Care | Payer: Medicare HMO | Source: Ambulatory Visit | Attending: Ophthalmology | Admitting: Ophthalmology

## 2020-11-30 ENCOUNTER — Other Ambulatory Visit: Payer: Self-pay

## 2020-11-30 NOTE — H&P (Signed)
Surgical History & Physical  Patient Name: Kyle Molina DOB: 24-Jul-1940  Surgery: Cataract extraction with intraocular lens implant phacoemulsification; Left Eye  Surgeon: Fabio Pierce MD Surgery Date:  12-02-20 Pre-Op Date:  11-11-20  HPI: A 27 Yr. old male patient Pt referred by Dr. Charise Killian for cataract evaluation. The patient complains of difficulty when reading fine print, books, newspaper, instructions etc., which began 5 years ago. Both eyes are affected. The episode is gradual. The condition's severity is worsening. The complaint is associated with blurry vision. Pt states he can no longer read bible comfortably. Symptoms are negatively affecting pt's quality of life. Pt denies any eye drop use or increase in floaters/flashes of light. HPI Completed by Dr. Fabio Pierce  Medical History: Cataracts Macula Degeneration Glaucoma Diabetes - DM Type 2 High Blood Pressure LDL  Review of Systems Negative Allergic/Immunologic Negative Cardiovascular Negative Constitutional Negative Ear, Nose, Mouth & Throat Negative Endocrine Negative Eyes Negative Gastrointestinal Negative Genitourinary Negative Hemotologic/Lymphatic Negative Integumentary Negative Musculoskeletal Negative Neurological Negative Psychiatry Negative Respiratory  Social   Former smoker   Medication Metformin, Diazepam, Tramadol hydrochloride, Niacin, Doxazosin, Atorvastatin, Baby aspirin,   Sx/Procedures  None  Drug Allergies  Sulfa (sulfonamide antibiotics),   History & Physical: Heent: Cataract, Left Eye NECK: supple without bruits LUNGS: lungs clear to auscultation CV: regular rate and rhythm Abdomen: soft and non-tender  Impression & Plan: Assessment: 1.  COMBINED FORMS AGE RELATED CATARACT; Both Eyes (H25.813) 2.  Diabetes Type 2 No retinopathy (E11.9) 3.  BLEPHARITIS; Right Upper Lid, Right Lower Lid, Left Upper Lid, Left Lower Lid (H01.001, H01.002,H01.004,H01.005) 4.   DERMATOCHALASIS; Right Upper Lid, Left Upper Lid (H02.831, T51.761)  Plan: 1.  Cataract accounts for the patient's decreased vision. This visual impairment is not correctable with a tolerable change in glasses or contact lenses. Cataract surgery with an implantation of a new lens should significantly improve the visual and functional status of the patient. Discussed all risks, benefits, alternatives, and potential complications. Discussed the procedures and recovery. Patient desires to have surgery. A-scan ordered and performed today for intra-ocular lens calculations. The surgery will be performed in order to improve vision for driving, reading, and for eye examinations. Recommend phacoemulsification with intra-ocular lens. Recommend Dextenza for post-operative pain and inflammation. Left Eye non-dominant - first. Dilates poorly - shugacaine by protocol. History of alpha-blocker. Malyugin Ring. Omidira.  2.  Stressed importance of blood sugar and blood pressure control, and also yearly eye examinations. Discussed the need for ongoing proactive ocular exams and treatment, hopefully before visual symptoms develop.  3.  Recommend regular lid cleaning.  4.  Asymptomatic, recommend observation for now. Findings, prognosis and treatment options reviewed.

## 2020-12-02 ENCOUNTER — Ambulatory Visit (HOSPITAL_COMMUNITY)
Admission: RE | Admit: 2020-12-02 | Discharge: 2020-12-02 | Disposition: A | Discharge status: Home / Self Care | Payer: Medicare HMO | Attending: Ophthalmology | Admitting: Ophthalmology

## 2020-12-02 ENCOUNTER — Encounter (HOSPITAL_COMMUNITY)
Admission: RE | Disposition: A | Discharge status: Home / Self Care | Payer: Self-pay | Source: Home / Self Care | Attending: Ophthalmology

## 2020-12-02 ENCOUNTER — Ambulatory Visit (HOSPITAL_COMMUNITY): Payer: Medicare HMO | Admitting: Anesthesiology

## 2020-12-02 DIAGNOSIS — H02831 Dermatochalasis of right upper eyelid: Secondary | ICD-10-CM | POA: Insufficient documentation

## 2020-12-02 DIAGNOSIS — Z7982 Long term (current) use of aspirin: Secondary | ICD-10-CM | POA: Diagnosis not present

## 2020-12-02 DIAGNOSIS — H25813 Combined forms of age-related cataract, bilateral: Secondary | ICD-10-CM | POA: Insufficient documentation

## 2020-12-02 DIAGNOSIS — E1139 Type 2 diabetes mellitus with other diabetic ophthalmic complication: Secondary | ICD-10-CM | POA: Diagnosis not present

## 2020-12-02 DIAGNOSIS — H02834 Dermatochalasis of left upper eyelid: Secondary | ICD-10-CM | POA: Insufficient documentation

## 2020-12-02 DIAGNOSIS — Z882 Allergy status to sulfonamides status: Secondary | ICD-10-CM | POA: Diagnosis not present

## 2020-12-02 DIAGNOSIS — E1136 Type 2 diabetes mellitus with diabetic cataract: Secondary | ICD-10-CM | POA: Insufficient documentation

## 2020-12-02 DIAGNOSIS — H0100B Unspecified blepharitis left eye, upper and lower eyelids: Secondary | ICD-10-CM | POA: Diagnosis not present

## 2020-12-02 DIAGNOSIS — H3530 Unspecified macular degeneration: Secondary | ICD-10-CM | POA: Insufficient documentation

## 2020-12-02 DIAGNOSIS — H0100A Unspecified blepharitis right eye, upper and lower eyelids: Secondary | ICD-10-CM | POA: Insufficient documentation

## 2020-12-02 DIAGNOSIS — Z7984 Long term (current) use of oral hypoglycemic drugs: Secondary | ICD-10-CM | POA: Diagnosis not present

## 2020-12-02 DIAGNOSIS — Z79899 Other long term (current) drug therapy: Secondary | ICD-10-CM | POA: Insufficient documentation

## 2020-12-02 DIAGNOSIS — H2181 Floppy iris syndrome: Secondary | ICD-10-CM | POA: Diagnosis not present

## 2020-12-02 DIAGNOSIS — H42 Glaucoma in diseases classified elsewhere: Secondary | ICD-10-CM | POA: Insufficient documentation

## 2020-12-02 DIAGNOSIS — Z87891 Personal history of nicotine dependence: Secondary | ICD-10-CM | POA: Insufficient documentation

## 2020-12-02 HISTORY — PX: CATARACT EXTRACTION W/PHACO: SHX586

## 2020-12-02 LAB — GLUCOSE, CAPILLARY: Glucose-Capillary: 116 mg/dL — ABNORMAL HIGH (ref 70–99)

## 2020-12-02 SURGERY — PHACOEMULSIFICATION, CATARACT, WITH IOL INSERTION
Anesthesia: Monitor Anesthesia Care | Site: Eye | Laterality: Left

## 2020-12-02 MED ORDER — EPINEPHRINE PF 1 MG/ML IJ SOLN
INTRAMUSCULAR | Status: AC
  Filled 2020-12-02: qty 1

## 2020-12-02 MED ORDER — PHENYLEPHRINE HCL 2.5 % OP SOLN
1.0000 [drp] | OPHTHALMIC | Status: AC | PRN
  Administered 2020-12-02 (×3): 1 [drp] via OPHTHALMIC

## 2020-12-02 MED ORDER — PHENYLEPHRINE-KETOROLAC 1-0.3 % IO SOLN
INTRAOCULAR | Status: AC
  Filled 2020-12-02: qty 4

## 2020-12-02 MED ORDER — SODIUM HYALURONATE 23MG/ML IO SOSY
PREFILLED_SYRINGE | INTRAOCULAR | Status: DC | PRN
  Administered 2020-12-02: 0.6 mL via INTRAOCULAR

## 2020-12-02 MED ORDER — LIDOCAINE HCL 3.5 % OP GEL
1.0000 "application " | Freq: Once | OPHTHALMIC | Status: AC
  Administered 2020-12-02: 1 via OPHTHALMIC

## 2020-12-02 MED ORDER — PHENYLEPHRINE-KETOROLAC 1-0.3 % IO SOLN
INTRAOCULAR | Status: DC | PRN
  Administered 2020-12-02: 500 mL via OPHTHALMIC

## 2020-12-02 MED ORDER — SODIUM HYALURONATE 10 MG/ML IO SOLUTION
PREFILLED_SYRINGE | INTRAOCULAR | Status: DC | PRN
  Administered 2020-12-02: 0.85 mL via INTRAOCULAR

## 2020-12-02 MED ORDER — STERILE WATER FOR IRRIGATION IR SOLN
Status: DC | PRN
  Administered 2020-12-02: 250 mL

## 2020-12-02 MED ORDER — TROPICAMIDE 1 % OP SOLN
1.0000 [drp] | OPHTHALMIC | Status: AC | PRN
  Administered 2020-12-02 (×3): 1 [drp] via OPHTHALMIC

## 2020-12-02 MED ORDER — BSS IO SOLN
INTRAOCULAR | Status: DC | PRN
  Administered 2020-12-02: 15 mL via INTRAOCULAR

## 2020-12-02 MED ORDER — LIDOCAINE HCL (PF) 1 % IJ SOLN
INTRAOCULAR | Status: DC | PRN
  Administered 2020-12-02: 1 mL via OPHTHALMIC

## 2020-12-02 MED ORDER — TETRACAINE HCL 0.5 % OP SOLN
1.0000 [drp] | OPHTHALMIC | Status: AC | PRN
  Administered 2020-12-02 (×3): 1 [drp] via OPHTHALMIC

## 2020-12-02 MED ORDER — POVIDONE-IODINE 5 % OP SOLN
OPHTHALMIC | Status: DC | PRN
  Administered 2020-12-02: 1 via OPHTHALMIC

## 2020-12-02 SURGICAL SUPPLY — 12 items
CLOTH BEACON ORANGE TIMEOUT ST (SAFETY) ×2 IMPLANT
EYE SHIELD UNIVERSAL CLEAR (GAUZE/BANDAGES/DRESSINGS) ×2 IMPLANT
GLOVE SURG UNDER POLY LF SZ6.5 (GLOVE) ×2 IMPLANT
GLOVE SURG UNDER POLY LF SZ7 (GLOVE) ×2 IMPLANT
NEEDLE HYPO 18GX1.5 BLUNT FILL (NEEDLE) ×2 IMPLANT
PAD ARMBOARD 7.5X6 YLW CONV (MISCELLANEOUS) ×2 IMPLANT
RAYONE EMV US (Intraocular Lens) ×2 IMPLANT
RING MALYGIN 7.0 (MISCELLANEOUS) IMPLANT
SYR TB 1ML LL NO SAFETY (SYRINGE) ×2 IMPLANT
TAPE SURG TRANSPORE 1 IN (GAUZE/BANDAGES/DRESSINGS) ×1 IMPLANT
TAPE SURGICAL TRANSPORE 1 IN (GAUZE/BANDAGES/DRESSINGS) ×2
WATER STERILE IRR 250ML POUR (IV SOLUTION) ×2 IMPLANT

## 2020-12-02 NOTE — Op Note (Signed)
Date of procedure: 12/02/20  Pre-operative diagnosis: Visually significant combined age-related cataract, Left Eye; Poor dilation, Left eye (H25.812; H21.81)  Post-operative diagnosis: Visually significant cataract, Left Eye; Intra-operative Floppy Iris Syndrome, Left Eye  Procedure: Complex removal of cataract via phacoemulsification and insertion of intra-ocular lens Rayner [id number] +23.0D into the capsular bag of the Left Eye (CPT [phone])  Attending surgeon: Gerda Diss. Kordell Jafri, MD, MA  Anesthesia: MAC, Topical Akten  Complications: None  Estimated Blood Loss: <73m (minimal)  Specimens: None  Implants: As above  Indications:  Visually significant cataract, Left Eye  Procedure:  The patient was seen and identified in the pre-operative area. The operative eye was identified and dilated.  The operative eye was marked.  Topical anesthesia was administered to the operative eye.     The patient was then to the operative suite and placed in the supine position.  A timeout was performed confirming the patient, procedure to be performed, and all other relevant information.   The patient's face was prepped and draped in the usual fashion for intra-ocular surgery.  A lid speculum was placed into the operative eye and the surgical microscope moved into place and focused.  Poor dilation of the iris was confirmed.  An inferotemporal paracentesis was created using a 20 gauge paracentesis blade.  Shugarcaine was injected into the anterior chamber.  Viscoelastic was injected into the anterior chamber.  A temporal clear-corneal main wound incision was created using a 2.445mmicrokeratome.  A Malyugin ring was placed.  A continuous curvilinear capsulorrhexis was initiated using an irrigating cystitome and completed using capsulorrhexis forceps.  Hydrodissection and hydrodeliniation were performed.  Viscoelastic was injected into the anterior chamber.  A phacoemulsification handpiece and a chopper as a second  instrument were used to remove the nucleus and epinucleus. The irrigation/aspiration handpiece was used to remove any remaining cortical material.   The capsular bag was reinflated with viscoelastic, checked, and found to be intact.  The intraocular lens was inserted into the capsular bag and dialed into place using a MaSurveyor, mineralsThe Malyugin ring was removed.  The irrigation/aspiration handpiece was used to remove any remaining viscoelastic.  The clear corneal wound and paracentesis wounds were then hydrated and checked with Weck-Cels to be watertight.  The lid-speculum and drape was removed, and the patient's face was cleaned with a wet and dry 4x4.  Maxitrol was instilled in the eye before a clear shield was taped over the eye. The patient was taken to the post-operative care unit in good condition, having tolerated the procedure well.  Post-Op Instructions: [facility] and will receive all other orders and instructions.

## 2020-12-02 NOTE — Discharge Instructions (Signed)
Please discharge patient when stable, will follow up today with Dr. Babyboy Loya at the [facility] office immediately following discharge.  Leave shield in place until visit.  All paperwork with discharge instructions will be given at the office.  [facility] Address:  [address]  [place], [place] [address]  

## 2020-12-02 NOTE — Anesthesia Postprocedure Evaluation (Signed)
Anesthesia Post Note  Patient: Kyle Molina  Procedure(s) Performed: CATARACT EXTRACTION PHACO AND INTRAOCULAR LENS PLACEMENT (IOC) (Left: Eye)  Patient location during evaluation: Phase II Anesthesia Type: MAC Level of consciousness: awake and alert and oriented Pain management: pain level controlled Vital Signs Assessment: post-procedure vital signs reviewed and stable Respiratory status: spontaneous breathing, nonlabored ventilation and respiratory function stable Cardiovascular status: stable and blood pressure returned to baseline Postop Assessment: no apparent nausea or vomiting Anesthetic complications: no   No notable events documented.   Last Vitals:  Vitals:   12/02/20 1032 12/02/20 1123  BP: 128/70 131/73  Pulse: 70 79  Resp: 18 16  Temp: 36.4 C 36.7 C  SpO2: 98% 98%    Last Pain:  Vitals:   12/02/20 1123  TempSrc: Oral  PainSc: 0-No pain                 Kyle Molina

## 2020-12-02 NOTE — Transfer of Care (Signed)
Immediate Anesthesia Transfer of Care Note  Patient: Kyle Molina  Procedure(s) Performed: CATARACT EXTRACTION PHACO AND INTRAOCULAR LENS PLACEMENT (IOC) (Left: Eye)  Patient Location: Short Stay  Anesthesia Type:MAC  Level of Consciousness: awake, alert  and oriented  Airway & Oxygen Therapy: Patient Spontanous Breathing  Post-op Assessment: Report given to RN and Post -op Vital signs reviewed and stable  Post vital signs: Reviewed and stable  Last Vitals:  Vitals Value Taken Time  BP 131/73 12/02/20 1123  Temp 36.7 C 12/02/20 1123  Pulse 79 12/02/20 1123  Resp 16 12/02/20 1123  SpO2 98 % 12/02/20 1123    Last Pain:  Vitals:   12/02/20 1123  TempSrc: Oral  PainSc: 0-No pain      Patients Stated Pain Goal: 6 (44/46/19 0122)  Complications: No notable events documented.

## 2020-12-02 NOTE — Interval H&P Note (Signed)
History and Physical Interval Note:  12/02/2020 10:51 AM  Kyle Molina  has presented today for surgery, with the diagnosis of nuclear cataract left eye.  The various methods of treatment have been discussed with the patient and family. After consideration of risks, benefits and other options for treatment, the patient has consented to  Procedure(s) with comments: CATARACT EXTRACTION PHACO AND INTRAOCULAR LENS PLACEMENT (IOC) (Left) - left as a surgical intervention.  The patient's history has been reviewed, patient examined, no change in status, stable for surgery.  I have reviewed the patient's chart and labs.  Questions were answered to the patient's satisfaction.     Fabio Pierce

## 2020-12-02 NOTE — Anesthesia Procedure Notes (Signed)
Procedure Name: MAC Date/Time: 12/02/2020 11:01 AM Performed by: Orlie Dakin, CRNA Pre-anesthesia Checklist: Patient identified, Emergency Drugs available, Suction available and Patient being monitored Patient Re-evaluated:Patient Re-evaluated prior to induction Oxygen Delivery Method: Nasal cannula Placement Confirmation: positive ETCO2

## 2020-12-02 NOTE — Anesthesia Preprocedure Evaluation (Addendum)
Anesthesia Evaluation  Patient identified by MRN, date of birth, ID band Patient awake    Reviewed: Allergy & Precautions, NPO status , Patient's Chart, lab work & pertinent test results  Airway Mallampati: II  TM Distance: >3 FB Neck ROM: Full    Dental  (+) Edentulous Upper, Edentulous Lower   Pulmonary neg pulmonary ROS,    Pulmonary exam normal breath sounds clear to auscultation       Cardiovascular Exercise Tolerance: Good hypertension, Pt. on medications Normal cardiovascular exam Rhythm:Regular Rate:Normal     Neuro/Psych Anxiety negative neurological ROS     GI/Hepatic negative GI ROS, (+) Hepatitis -  Endo/Other  diabetes, Well Controlled, Type 2, Oral Hypoglycemic Agents  Renal/GU  Bladder dysfunction (overactive bladder, BPH)      Musculoskeletal negative musculoskeletal ROS (+)   Abdominal   Peds  Hematology  (+) anemia ,   Anesthesia Other Findings   Reproductive/Obstetrics negative OB ROS                           Anesthesia Physical Anesthesia Plan  ASA: 2  Anesthesia Plan: MAC   Post-op Pain Management:    Induction:   PONV Risk Score and Plan:   Airway Management Planned: Nasal Cannula and Natural Airway  Additional Equipment:   Intra-op Plan:   Post-operative Plan:   Informed Consent: I have reviewed the patients History and Physical, chart, labs and discussed the procedure including the risks, benefits and alternatives for the proposed anesthesia with the patient or authorized representative who has indicated his/her understanding and acceptance.       Plan Discussed with: CRNA and Surgeon  Anesthesia Plan Comments:        Anesthesia Quick Evaluation

## 2020-12-06 ENCOUNTER — Encounter (HOSPITAL_COMMUNITY): Payer: Self-pay | Admitting: Ophthalmology

## 2020-12-22 ENCOUNTER — Encounter (HOSPITAL_COMMUNITY)
Admission: RE | Admit: 2020-12-22 | Discharge: 2020-12-22 | Disposition: A | Discharge status: Home / Self Care | Payer: Medicare HMO | Source: Ambulatory Visit | Attending: Ophthalmology | Admitting: Ophthalmology

## 2020-12-22 ENCOUNTER — Encounter (HOSPITAL_COMMUNITY): Payer: Self-pay

## 2020-12-22 ENCOUNTER — Other Ambulatory Visit: Payer: Self-pay

## 2020-12-23 NOTE — H&P (Signed)
Surgical History & Physical  Patient Name: Kyle Molina DOB: 1940/10/12  Surgery: Cataract extraction with intraocular lens implant phacoemulsification; Right Eye  Surgeon: Fabio Pierce MD Surgery Date:  12-27-20 Pre-Op Date:  12-09-20  HPI: A 75 Yr. old male patient The patient is returning after cataract surgery. The left eye is affected. Status post cataract surgery, which began 1 week ago: Since the last visit, the affected area is doing well. The patient's vision is improved. Patient is following medication instructions. PO combo drops BID OS. Pt denies any increase in floaters/flashes of light. The patient complains of difficulty when reading fine print, books, newspaper, instructions etc., which began 5 years ago. THe right eye is affected. The episode is gradual. The condition's severity is worsening. The complaint is associated with blurry vision. Pt states he can no longer read bible comfortably. Symptoms are negatively affecting pt's quality of life. HPI Completed by Dr. Fabio Pierce  Medical History: Cataracts Macula Degeneration Glaucoma Diabetes - DM Type 2 High Blood Pressure LDL  Review of Systems Negative Allergic/Immunologic Negative Cardiovascular Negative Constitutional Negative Ear, Nose, Mouth & Throat Negative Endocrine Negative Eyes Negative Gastrointestinal Negative Genitourinary Negative Hemotologic/Lymphatic Negative Integumentary Negative Musculoskeletal Negative Neurological Negative Psychiatry Negative Respiratory  Social   Former smoker   Medication Prednisolone-Moxifloxacin-Bromfenac, Metformin, Diazepam, Tramadol hydrochloride, Niacin, Doxazosin, Atorvastatin, Baby aspirin,   Sx/Procedures Phaco c IOL OS,   Drug Allergies  Sulfa (sulfonamide antibiotics),   History & Physical: Heent: Cataract, Right Eye NECK: supple without bruits LUNGS: lungs clear to auscultation CV: regular rate and rhythm Abdomen: soft and  non-tender  Impression & Plan: Assessment: 1.  CATARACT EXTRACTION STATUS; Left Eye (Z98.42) 2.  INTRAOCULAR LENS IOL ; Left Eye (Z96.1) 3.  COMBINED FORMS AGE RELATED CATARACT; Right Eye (H25.811) 4.  Myopia ; Left Eye (H52.12)  Plan: 1.  1 week after cataract surgery. Doing well with improved vision and normal eye pressure. Call with any problems or concerns. Continue Pred-Moxi-Brom 2x/day for 3 more weeks.  2.  Doing well since surgery Continue Post-op medications  3.  Cataract accounts for the patient's decreased vision. This visual impairment is not correctable with a tolerable change in glasses or contact lenses. Cataract surgery with an implantation of a new lens should significantly improve the visual and functional status of the patient. Discussed all risks, benefits, alternatives, and potential complications. Discussed the procedures and recovery. Patient desires to have surgery. A-scan ordered and performed today for intra-ocular lens calculations. The surgery will be performed in order to improve vision for driving, reading, and for eye examinations. Recommend phacoemulsification with intra-ocular lens. Recommend Dextenza for post-operative pain and inflammation. Right Eye. Surgery required to correct imbalance of vision. Dilates poorly - shugacaine by protocol. History of alpha-blocker. Malyugin Ring. Omidira.  4.

## 2020-12-27 ENCOUNTER — Encounter (HOSPITAL_COMMUNITY): Payer: Self-pay | Admitting: Ophthalmology

## 2020-12-27 ENCOUNTER — Encounter (HOSPITAL_COMMUNITY)
Admission: RE | Disposition: A | Discharge status: Home / Self Care | Payer: Self-pay | Source: Home / Self Care | Attending: Ophthalmology

## 2020-12-27 ENCOUNTER — Ambulatory Visit (HOSPITAL_COMMUNITY)
Admission: RE | Admit: 2020-12-27 | Discharge: 2020-12-27 | Disposition: A | Discharge status: Home / Self Care | Payer: Medicare HMO | Attending: Ophthalmology | Admitting: Ophthalmology

## 2020-12-27 ENCOUNTER — Ambulatory Visit (HOSPITAL_COMMUNITY): Payer: Medicare HMO | Admitting: Certified Registered Nurse Anesthetist

## 2020-12-27 DIAGNOSIS — F419 Anxiety disorder, unspecified: Secondary | ICD-10-CM | POA: Diagnosis not present

## 2020-12-27 DIAGNOSIS — H25811 Combined forms of age-related cataract, right eye: Secondary | ICD-10-CM | POA: Insufficient documentation

## 2020-12-27 DIAGNOSIS — E1136 Type 2 diabetes mellitus with diabetic cataract: Secondary | ICD-10-CM | POA: Diagnosis present

## 2020-12-27 DIAGNOSIS — H2181 Floppy iris syndrome: Secondary | ICD-10-CM | POA: Diagnosis not present

## 2020-12-27 DIAGNOSIS — H5212 Myopia, left eye: Secondary | ICD-10-CM | POA: Diagnosis not present

## 2020-12-27 DIAGNOSIS — N400 Enlarged prostate without lower urinary tract symptoms: Secondary | ICD-10-CM | POA: Diagnosis not present

## 2020-12-27 DIAGNOSIS — I10 Essential (primary) hypertension: Secondary | ICD-10-CM | POA: Diagnosis not present

## 2020-12-27 DIAGNOSIS — Z961 Presence of intraocular lens: Secondary | ICD-10-CM | POA: Insufficient documentation

## 2020-12-27 DIAGNOSIS — Z7984 Long term (current) use of oral hypoglycemic drugs: Secondary | ICD-10-CM | POA: Diagnosis not present

## 2020-12-27 DIAGNOSIS — Z79899 Other long term (current) drug therapy: Secondary | ICD-10-CM | POA: Insufficient documentation

## 2020-12-27 DIAGNOSIS — Z9842 Cataract extraction status, left eye: Secondary | ICD-10-CM | POA: Diagnosis not present

## 2020-12-27 HISTORY — PX: CATARACT EXTRACTION W/PHACO: SHX586

## 2020-12-27 LAB — GLUCOSE, CAPILLARY: Glucose-Capillary: 149 mg/dL — ABNORMAL HIGH (ref 70–99)

## 2020-12-27 SURGERY — PHACOEMULSIFICATION, CATARACT, WITH IOL INSERTION
Anesthesia: Monitor Anesthesia Care | Site: Eye | Laterality: Right

## 2020-12-27 MED ORDER — SODIUM HYALURONATE 23MG/ML IO SOSY
PREFILLED_SYRINGE | INTRAOCULAR | Status: DC | PRN
  Administered 2020-12-27: 0.6 mL via INTRAOCULAR

## 2020-12-27 MED ORDER — POVIDONE-IODINE 5 % OP SOLN
OPHTHALMIC | Status: DC | PRN
  Administered 2020-12-27: 1 via OPHTHALMIC

## 2020-12-27 MED ORDER — EPINEPHRINE PF 1 MG/ML IJ SOLN
INTRAMUSCULAR | Status: AC
  Filled 2020-12-27: qty 1

## 2020-12-27 MED ORDER — PHENYLEPHRINE-KETOROLAC 1-0.3 % IO SOLN
INTRAOCULAR | Status: DC | PRN
  Administered 2020-12-27: 500 mL via OPHTHALMIC

## 2020-12-27 MED ORDER — STERILE WATER FOR IRRIGATION IR SOLN
Status: DC | PRN
  Administered 2020-12-27: 250 mL

## 2020-12-27 MED ORDER — LIDOCAINE HCL (PF) 1 % IJ SOLN
INTRAOCULAR | Status: DC | PRN
  Administered 2020-12-27: 1 mL via OPHTHALMIC

## 2020-12-27 MED ORDER — TROPICAMIDE 1 % OP SOLN
1.0000 [drp] | OPHTHALMIC | Status: AC | PRN
  Administered 2020-12-27 (×3): 1 [drp] via OPHTHALMIC
  Filled 2020-12-27: qty 2

## 2020-12-27 MED ORDER — PHENYLEPHRINE-KETOROLAC 1-0.3 % IO SOLN
INTRAOCULAR | Status: AC
  Filled 2020-12-27: qty 4

## 2020-12-27 MED ORDER — BSS IO SOLN
INTRAOCULAR | Status: DC | PRN
  Administered 2020-12-27: 15 mL

## 2020-12-27 MED ORDER — TETRACAINE HCL 0.5 % OP SOLN
1.0000 [drp] | OPHTHALMIC | Status: AC | PRN
  Administered 2020-12-27 (×3): 1 [drp] via OPHTHALMIC

## 2020-12-27 MED ORDER — PHENYLEPHRINE HCL 2.5 % OP SOLN
1.0000 [drp] | OPHTHALMIC | Status: AC | PRN
  Administered 2020-12-27 (×3): 1 [drp] via OPHTHALMIC

## 2020-12-27 MED ORDER — LIDOCAINE HCL 3.5 % OP GEL
1.0000 "application " | Freq: Once | OPHTHALMIC | Status: AC
  Administered 2020-12-27: 1 via OPHTHALMIC

## 2020-12-27 MED ORDER — LACTATED RINGERS IV SOLN: INTRAVENOUS | Status: DC

## 2020-12-27 MED ORDER — SODIUM HYALURONATE 10 MG/ML IO SOLUTION
PREFILLED_SYRINGE | INTRAOCULAR | Status: DC | PRN
  Administered 2020-12-27: 0.85 mL via INTRAOCULAR

## 2020-12-27 SURGICAL SUPPLY — 11 items
CLOTH BEACON ORANGE TIMEOUT ST (SAFETY) ×2 IMPLANT
EYE SHIELD UNIVERSAL CLEAR (GAUZE/BANDAGES/DRESSINGS) ×2 IMPLANT
GLOVE SURG UNDER POLY LF SZ7 (GLOVE) ×4 IMPLANT
NEEDLE HYPO 18GX1.5 BLUNT FILL (NEEDLE) ×2 IMPLANT
PAD ARMBOARD 7.5X6 YLW CONV (MISCELLANEOUS) ×2 IMPLANT
RING MALYGIN 7.0 (MISCELLANEOUS) IMPLANT
RayOne EMV US (Intraocular Lens) ×2 IMPLANT
SYR TB 1ML LL NO SAFETY (SYRINGE) ×2 IMPLANT
TAPE SURG TRANSPORE 1 IN (GAUZE/BANDAGES/DRESSINGS) ×1 IMPLANT
TAPE SURGICAL TRANSPORE 1 IN (GAUZE/BANDAGES/DRESSINGS) ×2
WATER STERILE IRR 250ML POUR (IV SOLUTION) ×2 IMPLANT

## 2020-12-27 NOTE — Discharge Instructions (Signed)
Please discharge patient when stable, will follow up today with Dr. Seleni Meller at the [facility] [place] office immediately following discharge.  Leave shield in place until visit.  All paperwork with discharge instructions will be given at the office.  [facility] Address:  [address]  [place], [place] [address]  

## 2020-12-27 NOTE — Transfer of Care (Signed)
Immediate Anesthesia Transfer of Care Note  Patient: Kyle Molina  Procedure(s) Performed: CATARACT EXTRACTION PHACO AND INTRAOCULAR LENS PLACEMENT RIGHT EYE (Right: Eye)  Patient Location: Short Stay  Anesthesia Type:MAC  Level of Consciousness: awake, alert  and oriented  Airway & Oxygen Therapy: Patient Spontanous Breathing  Post-op Assessment: Report given to RN and Post -op Vital signs reviewed and stable  Post vital signs: Reviewed and stable  Last Vitals:  Vitals Value Taken Time  BP    Temp    Pulse    Resp    SpO2      Last Pain:  Vitals:   12/27/20 0901  TempSrc: Oral  PainSc: 0-No pain         Complications: No notable events documented.

## 2020-12-27 NOTE — Anesthesia Postprocedure Evaluation (Signed)
Anesthesia Post Note  Patient: Kyle Molina  Procedure(s) Performed: CATARACT EXTRACTION PHACO AND INTRAOCULAR LENS PLACEMENT RIGHT EYE (Right: Eye)  Patient location during evaluation: Phase II Anesthesia Type: MAC Level of consciousness: awake Pain management: pain level controlled Vital Signs Assessment: post-procedure vital signs reviewed and stable Respiratory status: spontaneous breathing and respiratory function stable Cardiovascular status: blood pressure returned to baseline and stable Postop Assessment: no headache and no apparent nausea or vomiting Anesthetic complications: no Comments: Late entry   No notable events documented.   Last Vitals:  Vitals:   12/27/20 0906 12/27/20 0954  BP: (!) 148/79 123/71  Pulse:  90  Resp:  18  Temp:  36.5 C  SpO2:  97%    Last Pain:  Vitals:   12/27/20 0954  TempSrc: Axillary  PainSc:                  Louann Sjogren

## 2020-12-27 NOTE — Interval H&P Note (Signed)
History and Physical Interval Note:  12/27/2020 9:27 AM  Kyle Molina  has presented today for surgery, with the diagnosis of nuclear cataract right eye.  The various methods of treatment have been discussed with the patient and family. After consideration of risks, benefits and other options for treatment, the patient has consented to  Procedure(s) with comments: CATARACT EXTRACTION PHACO AND INTRAOCULAR LENS PLACEMENT (IOC) (Right) - right as a surgical intervention.  The patient's history has been reviewed, patient examined, no change in status, stable for surgery.  I have reviewed the patient's chart and labs.  Questions were answered to the patient's satisfaction.     Fabio Pierce

## 2020-12-27 NOTE — Op Note (Signed)
Date of procedure: 12/27/20  Pre-operative diagnosis: Visually significant combined-form cataract, Right Eye; Poor Dilation, Right Eye (H25.811; H21.81)  Post-operative diagnosis: Visually significant combined-form cataract, Right Eye; Intra-operative Floppy Iris Syndrome, Right Eye  Procedure: Removal of cataract via phacoemulsification and insertion of intra-ocular lens Rayner [id number] +23.0D into the capsular bag of the Right Eye (CPT [phone])  Attending surgeon: Gerda Diss. Julie-Ann Vanmaanen, MD, MA  Anesthesia: MAC, Topical Akten  Complications: None  Estimated Blood Loss: <37m (minimal)  Specimens: None  Implants: As above  Indications:  Visually significant cataract, Right Eye  Procedure:  The patient was seen and identified in the pre-operative area. The operative eye was identified and dilated.  The operative eye was marked.  Topical anesthesia was administered to the operative eye.     The patient was then to the operative suite and placed in the supine position.  A timeout was performed confirming the patient, procedure to be performed, and all other relevant information.   The patient's face was prepped and draped in the usual fashion for intra-ocular surgery.  A lid speculum was placed into the operative eye and the surgical microscope moved into place and focused.  Poor dilation of the iris was confirmed.  A superotemporal paracentesis was created using a 20 gauge paracentesis blade.  Shugarcaine was injected into the anterior chamber.  Viscoelastic was injected into the anterior chamber.  A temporal clear-corneal main wound incision was created using a 2.464mmicrokeratome.  A Malyugin ring was placed.  A continuous curvilinear capsulorrhexis was initiated using an irrigating cystitome and completed using capsulorrhexis forceps.  Hydrodissection and hydrodeliniation were performed.  Viscoelastic was injected into the anterior chamber.  A phacoemulsification handpiece and a chopper as a second  instrument were used to remove the nucleus and epinucleus. The irrigation/aspiration handpiece was used to remove any remaining cortical material.   The capsular bag was reinflated with viscoelastic, checked, and found to be intact.  The intraocular lens was inserted into the capsular bag and dialed into place using a MaSurveyor, minerals The Malyugin ring was removed.  The irrigation/aspiration handpiece was used to remove any remaining viscoelastic.  The clear corneal wound and paracentesis wounds were then hydrated and checked with Weck-Cels to be watertight.  The lid-speculum and drape was removed, and the patient's face was cleaned with a wet and dry 4x4. A clear shield was taped over the eye. The patient was taken to the post-operative care unit in good condition, having tolerated the procedure well.  Post-Op Instructions: [facility] and will receive all other orders and instructions.

## 2020-12-27 NOTE — Anesthesia Preprocedure Evaluation (Signed)
Anesthesia Evaluation  Patient identified by MRN, date of birth, ID band Patient awake    Reviewed: Allergy & Precautions, NPO status , Patient's Chart, lab work & pertinent test results  Airway Mallampati: II  TM Distance: >3 FB Neck ROM: Full    Dental  (+) Edentulous Upper, Edentulous Lower   Pulmonary neg pulmonary ROS,    Pulmonary exam normal breath sounds clear to auscultation       Cardiovascular Exercise Tolerance: Good hypertension, Pt. on medications Normal cardiovascular exam Rhythm:Regular Rate:Normal     Neuro/Psych Anxiety negative neurological ROS     GI/Hepatic negative GI ROS, (+) Hepatitis -  Endo/Other  diabetes, Well Controlled, Type 2, Oral Hypoglycemic Agents  Renal/GU  Bladder dysfunction (overactive bladder, BPH)      Musculoskeletal negative musculoskeletal ROS (+)   Abdominal   Peds  Hematology  (+) anemia ,   Anesthesia Other Findings   Reproductive/Obstetrics negative OB ROS                             Anesthesia Physical  Anesthesia Plan  ASA: 2  Anesthesia Plan: MAC   Post-op Pain Management:    Induction:   PONV Risk Score and Plan:   Airway Management Planned: Nasal Cannula and Natural Airway  Additional Equipment:   Intra-op Plan:   Post-operative Plan:   Informed Consent: I have reviewed the patients History and Physical, chart, labs and discussed the procedure including the risks, benefits and alternatives for the proposed anesthesia with the patient or authorized representative who has indicated his/her understanding and acceptance.       Plan Discussed with: CRNA and Surgeon  Anesthesia Plan Comments:         Anesthesia Quick Evaluation

## 2020-12-28 ENCOUNTER — Encounter (HOSPITAL_COMMUNITY): Payer: Self-pay | Admitting: Ophthalmology

## 2021-02-07 ENCOUNTER — Encounter (HOSPITAL_COMMUNITY): Payer: Self-pay

## 2021-02-07 ENCOUNTER — Emergency Department (HOSPITAL_COMMUNITY): Payer: Medicare HMO

## 2021-02-07 ENCOUNTER — Emergency Department (HOSPITAL_COMMUNITY)
Admission: EM | Admit: 2021-02-07 | Discharge: 2021-02-07 | Disposition: A | Discharge status: Home / Self Care | Payer: Medicare HMO | Attending: Emergency Medicine | Admitting: Emergency Medicine

## 2021-02-07 ENCOUNTER — Other Ambulatory Visit: Payer: Self-pay

## 2021-02-07 DIAGNOSIS — R1013 Epigastric pain: Secondary | ICD-10-CM | POA: Insufficient documentation

## 2021-02-07 DIAGNOSIS — E119 Type 2 diabetes mellitus without complications: Secondary | ICD-10-CM | POA: Insufficient documentation

## 2021-02-07 DIAGNOSIS — I10 Essential (primary) hypertension: Secondary | ICD-10-CM | POA: Diagnosis not present

## 2021-02-07 DIAGNOSIS — Z7984 Long term (current) use of oral hypoglycemic drugs: Secondary | ICD-10-CM | POA: Insufficient documentation

## 2021-02-07 DIAGNOSIS — Z79899 Other long term (current) drug therapy: Secondary | ICD-10-CM | POA: Insufficient documentation

## 2021-02-07 DIAGNOSIS — R7401 Elevation of levels of liver transaminase levels: Secondary | ICD-10-CM | POA: Diagnosis not present

## 2021-02-07 DIAGNOSIS — Z7982 Long term (current) use of aspirin: Secondary | ICD-10-CM | POA: Diagnosis not present

## 2021-02-07 DIAGNOSIS — R112 Nausea with vomiting, unspecified: Secondary | ICD-10-CM | POA: Insufficient documentation

## 2021-02-07 LAB — COMPREHENSIVE METABOLIC PANEL
ALT: 204 U/L — ABNORMAL HIGH (ref 0–44)
AST: 445 U/L — ABNORMAL HIGH (ref 15–41)
Albumin: 4.3 g/dL (ref 3.5–5.0)
Alkaline Phosphatase: 104 U/L (ref 38–126)
Anion gap: 13 (ref 5–15)
BUN: 19 mg/dL (ref 8–23)
CO2: 22 mmol/L (ref 22–32)
Calcium: 8.8 mg/dL — ABNORMAL LOW (ref 8.9–10.3)
Chloride: 107 mmol/L (ref 98–111)
Creatinine, Ser: 1.07 mg/dL (ref 0.61–1.24)
GFR, Estimated: >60 mL/min (ref >60)
Glucose, Bld: 175 mg/dL — ABNORMAL HIGH (ref 70–99)
Potassium: 3.6 mmol/L (ref 3.5–5.1)
Sodium: 142 mmol/L (ref 135–145)
Total Bilirubin: 4.2 mg/dL — ABNORMAL HIGH (ref 0.3–1.2)
Total Protein: 7.1 g/dL (ref 6.5–8.1)

## 2021-02-07 LAB — CBC
HCT: 40.5 % (ref 39.0–52.0)
Hemoglobin: 13.5 g/dL (ref 13.0–17.0)
MCH: 29.5 pg (ref 26.0–34.0)
MCHC: 33.3 g/dL (ref 30.0–36.0)
MCV: 88.6 fL (ref 80.0–100.0)
Platelets: 115 10*3/uL — ABNORMAL LOW (ref 150–400)
RBC: 4.57 MIL/uL (ref 4.22–5.81)
RDW: 13.6 % (ref 11.5–15.5)
WBC: 5.7 10*3/uL (ref 4.0–10.5)
nRBC: 0 % (ref 0.0–0.2)

## 2021-02-07 LAB — LIPASE, BLOOD: Lipase: 28 U/L (ref 11–51)

## 2021-02-07 MED ORDER — SODIUM CHLORIDE 0.9 % IV BOLUS
1000.0000 mL | Freq: Once | INTRAVENOUS | Status: AC
  Administered 2021-02-07: 18:00:00 1000 mL via INTRAVENOUS

## 2021-02-07 MED ORDER — IOHEXOL 300 MG/ML  SOLN
100.0000 mL | Freq: Once | INTRAMUSCULAR | Status: AC | PRN
  Administered 2021-02-07: 20:00:00 100 mL via INTRAVENOUS

## 2021-02-07 NOTE — ED Notes (Signed)
Rec dc papers . All questions answered. Ambulatory to lobby with wife.

## 2021-02-07 NOTE — Discharge Instructions (Signed)
Your liver enzymes were more elevated today than usual. Please follow up with your PCP regarding this. Please return to the Emergency Department if you develop worsening symptoms.

## 2021-02-07 NOTE — ED Triage Notes (Signed)
Pt via EMS from home with complaints of nausea and vomiting that started around noon today.

## 2021-02-07 NOTE — ED Provider Notes (Signed)
[facility] EMERGENCY DEPARTMENT Provider Note   CSN: [id number] Arrival date & time: 02/07/21  1659     History Chief Complaint  Patient presents with   Abdominal Pain    Kyle Molina is a 80 y.o. male.   Patient presents after having four episodes of nausea and vomiting starting around noon today. Vomit is nonbileous and non bloody. He had associated epigastric abdominal pain at the time.  He is since feeling much better.  He denies having any abdominal pain or nausea right now.  He has no chest pain or shortness of breath.  Says his most recent bowel movement was at 3 PM today and was normal without diarrhea or constipation.  He denies any blood in his vomit or stool.   Abdominal Pain Associated symptoms: nausea and vomiting   Associated symptoms: no constipation, no diarrhea, no dysuria, no fever and no hematuria       Past Medical History:  Diagnosis Date   Anxiety    BPH (benign prostatic hyperplasia)    Diabetes mellitus without complication (HCC)    Hyperbilirubinemia    Hyperlipidemia    Hypertension    Overactive bladder    Thrombocytopenia ([place])     Patient Active Problem List   Diagnosis Date Noted   Generalized weakness    Hyperbilirubinemia    Transaminitis    LFT elevation    Thrombocytopenia (HCC)    Anemia    Sepsis secondary to UTI ([place]) 09/23/2019   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   Cellulitis 10/24/2016   Hypertension    Hyperlipidemia    Type 2 diabetes mellitus (HCC)    Cellulitis of hand, left     Past Surgical History:  Procedure Laterality Date   APPENDECTOMY     CATARACT EXTRACTION W/PHACO Left 12/02/2020   Procedure: CATARACT EXTRACTION PHACO AND INTRAOCULAR LENS PLACEMENT ([place]);  Surgeon: Baruch Goldmann, MD;  Location: [facility];  Service: Ophthalmology;  Laterality: Left;  CDE 22.27   CATARACT EXTRACTION W/PHACO Right 12/27/2020   Procedure: CATARACT EXTRACTION PHACO AND INTRAOCULAR LENS PLACEMENT RIGHT EYE;  Surgeon: Baruch Goldmann, MD;  Location: [facility];  Service: Ophthalmology;  Laterality: Right;  right CDE=13.12   COLONOSCOPY  2010?   Dr. Jim Desanctis. patient states he was "all clear"       History reviewed. No pertinent family history.  Social History   Tobacco Use   Smoking status: Never   Smokeless tobacco: Never  Vaping Use   Vaping Use: Never used  Substance Use Topics   Alcohol use: No   Drug use: No    Home Medications Prior to Admission medications   Medication Sig Start Date End Date Taking? Authorizing Provider  aspirin 81 MG tablet Take 81 mg by mouth daily.    [provider]  atorvastatin (LIPITOR) 20 MG tablet Take 20 mg by mouth at bedtime.    [provider]  diazepam (VALIUM) 5 MG tablet Take 2.5 mg by mouth 2 (two) times daily.    [provider]  doxazosin (CARDURA) 8 MG tablet Take 8 mg by mouth daily with supper. 09/18/20   [provider]  metFORMIN (GLUCOPHAGE-XR) 500 MG 24 hr tablet Take 500 mg by mouth daily with supper. 08/29/19   [provider]  traMADol (ULTRAM) 50 MG tablet Take 50 mg by mouth in the morning and at bedtime. 11/11/20   [provider]  vitamin B-12 (CYANOCOBALAMIN) 1000 MCG tablet Take 1 tablet (1,000 mcg total) by mouth  daily. 09/27/19   Barnetta Chapel, MD    Allergies    Sulfa antibiotics  Review of Systems   Review of Systems  Constitutional:  Negative for fever.  Gastrointestinal:  Positive for abdominal pain, nausea and vomiting. Negative for abdominal distention, blood in stool, constipation and diarrhea.  Genitourinary:  Negative for dysuria and hematuria.  All other systems reviewed and are negative.  Physical Exam Updated Vital Signs BP 126/80    Pulse 88    Temp 98.6 F (37 C)    Resp 20    Ht 5\' 7"  (1.702 m)    Wt 81.6 kg    SpO2 100%    BMI 28.19 kg/m   Physical Exam Vitals and nursing note reviewed.  Constitutional:      General: He is not in acute distress.     Appearance: Normal appearance. He is well-developed. He is not ill-appearing, toxic-appearing or diaphoretic.  HENT:     Head: Normocephalic and atraumatic.     Nose: No nasal deformity.     Mouth/Throat:     Lips: Pink. No lesions.     Mouth: Mucous membranes are moist. No injury, lacerations, oral lesions or angioedema.     Pharynx: Oropharynx is clear. Uvula midline. No pharyngeal swelling, oropharyngeal exudate, posterior oropharyngeal erythema or uvula swelling.  Eyes:     General: Gaze aligned appropriately. No scleral icterus.       Right eye: No discharge.        Left eye: No discharge.     Conjunctiva/sclera: Conjunctivae normal.     Right eye: Right conjunctiva is not injected. No exudate or hemorrhage.    Left eye: Left conjunctiva is not injected. No exudate or hemorrhage. Cardiovascular:     Rate and Rhythm: Normal rate and regular rhythm.     Pulses: Normal pulses.          Radial pulses are 2+ on the right side and 2+ on the left side.       Dorsalis pedis pulses are 2+ on the right side and 2+ on the left side.     Heart sounds: Normal heart sounds, S1 normal and S2 normal. Heart sounds not distant. No murmur heard.   No friction rub. No gallop. No S3 or S4 sounds.  Pulmonary:     Effort: Pulmonary effort is normal. No accessory muscle usage or respiratory distress.     Breath sounds: Normal breath sounds. No stridor. No wheezing, rhonchi or rales.  Chest:     Chest wall: No tenderness.  Abdominal:     General: Abdomen is flat. Bowel sounds are normal. There is no distension.     Palpations: Abdomen is soft. There is no mass or pulsatile mass.     Tenderness: There is no abdominal tenderness. There is no guarding or rebound.  Musculoskeletal:     Right lower leg: No edema.     Left lower leg: No edema.  Skin:    General: Skin is warm and dry.     Coloration: Skin is not jaundiced or pale.     Findings: No bruising, erythema, lesion or rash.  Neurological:      General: No focal deficit present.     Mental Status: He is alert and oriented to person, place, and time.     GCS: GCS eye subscore is 4. GCS verbal subscore is 5. GCS motor subscore is 6.  Psychiatric:        Mood and Affect:  Mood normal.        Behavior: Behavior normal. Behavior is cooperative.    ED Results / Procedures / Treatments   Labs (all labs ordered are listed, but only abnormal results are displayed) Labs Reviewed  COMPREHENSIVE METABOLIC PANEL - Abnormal; Notable for the following components:      Result Value   Glucose, Bld 175 (*)    Calcium 8.8 (*)    AST 445 (*)    ALT 204 (*)    Total Bilirubin 4.2 (*)    All other components within normal limits  CBC - Abnormal; Notable for the following components:   Platelets 115 (*)    All other components within normal limits  LIPASE, BLOOD  URINALYSIS, ROUTINE W REFLEX MICROSCOPIC    EKG None  Radiology CT Abdomen Pelvis W Contrast  Result Date: 02/07/2021 CLINICAL DATA:  Nausea and vomiting with pain, initial encounter EXAM: CT ABDOMEN AND PELVIS WITH CONTRAST TECHNIQUE: Multidetector CT imaging of the abdomen and pelvis was performed using the standard protocol following bolus administration of intravenous contrast. CONTRAST:  148mL OMNIPAQUE IOHEXOL 300 MG/ML  SOLN COMPARISON:  None. FINDINGS: Lower chest: No acute abnormality. Hepatobiliary: No focal liver abnormality is seen. No gallstones, gallbladder wall thickening, or biliary dilatation. Pancreas: Unremarkable. No pancreatic ductal dilatation or surrounding inflammatory changes. Spleen: Normal in size without focal abnormality. Adrenals/Urinary Tract: Adrenal glands are within normal limits. Kidneys demonstrate a normal enhancement pattern. Right renal cyst is noted. Peripelvic cysts are noted bilaterally. No obstructive changes are seen. The bladder is within normal limits. Stomach/Bowel: Scattered diverticular changes noted without evidence of diverticulitis.  Appendix has been surgically removed. No obstructive or inflammatory changes of the small bowel are seen. Stomach is unremarkable. Vascular/Lymphatic: Aortic atherosclerosis. No enlarged abdominal or pelvic lymph nodes. Reproductive: Prostate is unremarkable. Other: No abdominal wall hernia or abnormality. No abdominopelvic ascites. Musculoskeletal: Degenerative changes of the lumbar spine are noted. IMPRESSION: Renal cysts bilaterally. Diverticulosis without diverticulitis. No other focal abnormality is seen. Electronically Signed   By: Inez Catalina M.D.   On: 02/07/2021 20:29    Procedures Procedures   Medications Ordered in ED Medications  sodium chloride 0.9 % bolus 1,000 mL (0 mLs Intravenous Stopped 02/07/21 2151)  iohexol (OMNIPAQUE) 300 MG/ML solution 100 mL (100 mLs Intravenous Contrast Given 02/07/21 2016)    ED Course  I have reviewed the triage vital signs and the nursing notes.  Pertinent labs & imaging results that were available during my care of the patient were reviewed by me and considered in my medical decision making (see chart for details).    MDM Rules/Calculators/A&P                          This is a 80 y.o. male with a PMH of HTN, HLD, DM2, and cholestatic hepatitis who presents to the ED with nausea and vomiting that started earlier this afternoon. Upon my interview of the patient, his symptoms have completely resolved.   Vitals:  stable. No fever. Exam: No abdominal tenderness. Patient appears comfortable  I personally reviewed all laboratory work and imaging. Abnormal results outlined below.  Patient with mild increase in baseline ALT to 204, AST 445 which is a pretty dramatic jump, T bili is 4.2. No leukocytosis. Lipase negative.   Since we do not have ultrasound right now at this facility, I will opt to evaluate biliary tract with CT scan.   CT is with no acute  abnormalities. No biliary dilation or gallbladder abnormality is noted.  On reassessment,  patient is completely asymptomatic. He continues to have no abdominal pain.  He is requesting to go home. I discussed with the patient at length about returning if his symptoms resume because he will likely need to be admitted for MRCP at that time. If he continues to be asymptomatic, he can follow up with his PCP regarding LFT abnormalities.     I have seen and evaluated this patient in conjunction with my attending physician who agrees and has made changes to the plan accordingly.  Portions of this note were generated with [employer]. Dictation errors may occur despite best attempts at proofreading.    Final Clinical Impression(s) / ED Diagnoses Final diagnoses:  Transaminitis    Rx / DC Orders ED Discharge Orders     None        Adolphus Birchwood, PA-C 02/08/21 Collene Mares, MD 02/08/21 [phone]

## 2021-02-17 ENCOUNTER — Inpatient Hospital Stay (HOSPITAL_COMMUNITY)
Admission: EM | Admit: 2021-02-17 | Discharge: 2021-02-20 | DRG: 872 | Disposition: A | Discharge status: Home / Self Care | Payer: Medicare HMO | Attending: Internal Medicine | Admitting: Internal Medicine

## 2021-02-17 ENCOUNTER — Other Ambulatory Visit: Payer: Self-pay

## 2021-02-17 ENCOUNTER — Encounter (HOSPITAL_COMMUNITY): Payer: Self-pay | Admitting: Emergency Medicine

## 2021-02-17 ENCOUNTER — Emergency Department (HOSPITAL_COMMUNITY): Payer: Medicare HMO

## 2021-02-17 DIAGNOSIS — Z961 Presence of intraocular lens: Secondary | ICD-10-CM | POA: Diagnosis present

## 2021-02-17 DIAGNOSIS — Z9049 Acquired absence of other specified parts of digestive tract: Secondary | ICD-10-CM | POA: Diagnosis not present

## 2021-02-17 DIAGNOSIS — N3281 Overactive bladder: Secondary | ICD-10-CM | POA: Diagnosis present

## 2021-02-17 DIAGNOSIS — R509 Fever, unspecified: Secondary | ICD-10-CM | POA: Diagnosis present

## 2021-02-17 DIAGNOSIS — E876 Hypokalemia: Secondary | ICD-10-CM | POA: Diagnosis present

## 2021-02-17 DIAGNOSIS — Z7982 Long term (current) use of aspirin: Secondary | ICD-10-CM

## 2021-02-17 DIAGNOSIS — Z7984 Long term (current) use of oral hypoglycemic drugs: Secondary | ICD-10-CM | POA: Diagnosis not present

## 2021-02-17 DIAGNOSIS — A419 Sepsis, unspecified organism: Principal | ICD-10-CM | POA: Diagnosis present

## 2021-02-17 DIAGNOSIS — Z9841 Cataract extraction status, right eye: Secondary | ICD-10-CM | POA: Diagnosis not present

## 2021-02-17 DIAGNOSIS — Z9842 Cataract extraction status, left eye: Secondary | ICD-10-CM

## 2021-02-17 DIAGNOSIS — B9620 Unspecified Escherichia coli [E. coli] as the cause of diseases classified elsewhere: Secondary | ICD-10-CM | POA: Diagnosis present

## 2021-02-17 DIAGNOSIS — I10 Essential (primary) hypertension: Secondary | ICD-10-CM | POA: Diagnosis present

## 2021-02-17 DIAGNOSIS — N390 Urinary tract infection, site not specified: Secondary | ICD-10-CM | POA: Diagnosis present

## 2021-02-17 DIAGNOSIS — F419 Anxiety disorder, unspecified: Secondary | ICD-10-CM | POA: Diagnosis present

## 2021-02-17 DIAGNOSIS — N401 Enlarged prostate with lower urinary tract symptoms: Secondary | ICD-10-CM | POA: Diagnosis present

## 2021-02-17 DIAGNOSIS — R531 Weakness: Secondary | ICD-10-CM

## 2021-02-17 DIAGNOSIS — E785 Hyperlipidemia, unspecified: Secondary | ICD-10-CM | POA: Diagnosis present

## 2021-02-17 DIAGNOSIS — R21 Rash and other nonspecific skin eruption: Secondary | ICD-10-CM | POA: Diagnosis present

## 2021-02-17 DIAGNOSIS — R2681 Unsteadiness on feet: Secondary | ICD-10-CM | POA: Diagnosis present

## 2021-02-17 DIAGNOSIS — Z882 Allergy status to sulfonamides status: Secondary | ICD-10-CM

## 2021-02-17 DIAGNOSIS — E119 Type 2 diabetes mellitus without complications: Secondary | ICD-10-CM | POA: Diagnosis present

## 2021-02-17 DIAGNOSIS — Z20822 Contact with and (suspected) exposure to covid-19: Secondary | ICD-10-CM | POA: Diagnosis present

## 2021-02-17 DIAGNOSIS — Z79899 Other long term (current) drug therapy: Secondary | ICD-10-CM | POA: Diagnosis not present

## 2021-02-17 LAB — CBC WITH DIFFERENTIAL/PLATELET
Abs Immature Granulocytes: 0.06 10*3/uL (ref 0.00–0.07)
Basophils Absolute: 0 10*3/uL (ref 0.0–0.1)
Basophils Relative: 0 %
Eosinophils Absolute: 0 10*3/uL (ref 0.0–0.5)
Eosinophils Relative: 0 %
HCT: 35.2 % — ABNORMAL LOW (ref 39.0–52.0)
Hemoglobin: 11.7 g/dL — ABNORMAL LOW (ref 13.0–17.0)
Immature Granulocytes: 1 %
Lymphocytes Relative: 3 %
Lymphs Abs: 0.4 10*3/uL — ABNORMAL LOW (ref 0.7–4.0)
MCH: 29.5 pg (ref 26.0–34.0)
MCHC: 33.2 g/dL (ref 30.0–36.0)
MCV: 88.9 fL (ref 80.0–100.0)
Monocytes Absolute: 0.6 10*3/uL (ref 0.1–1.0)
Monocytes Relative: 5 %
Neutro Abs: 10.1 10*3/uL — ABNORMAL HIGH (ref 1.7–7.7)
Neutrophils Relative %: 91 %
Platelets: 187 10*3/uL (ref 150–400)
RBC: 3.96 MIL/uL — ABNORMAL LOW (ref 4.22–5.81)
RDW: 14.2 % (ref 11.5–15.5)
WBC: 11.2 10*3/uL — ABNORMAL HIGH (ref 4.0–10.5)
nRBC: 0 % (ref 0.0–0.2)

## 2021-02-17 LAB — URINALYSIS, ROUTINE W REFLEX MICROSCOPIC
Bilirubin Urine: NEGATIVE
Glucose, UA: NEGATIVE mg/dL
Ketones, ur: NEGATIVE mg/dL
Nitrite: NEGATIVE
Protein, ur: 30 mg/dL — AB
Specific Gravity, Urine: 1.005 (ref 1.005–1.030)
WBC, UA: >50 WBC/hpf — ABNORMAL HIGH (ref 0–5)
pH: 6 (ref 5.0–8.0)

## 2021-02-17 LAB — COMPREHENSIVE METABOLIC PANEL
ALT: 48 U/L — ABNORMAL HIGH (ref 0–44)
AST: 41 U/L (ref 15–41)
Albumin: 2.9 g/dL — ABNORMAL LOW (ref 3.5–5.0)
Alkaline Phosphatase: 118 U/L (ref 38–126)
Anion gap: 10 (ref 5–15)
BUN: 9 mg/dL (ref 8–23)
CO2: 21 mmol/L — ABNORMAL LOW (ref 22–32)
Calcium: 8.1 mg/dL — ABNORMAL LOW (ref 8.9–10.3)
Chloride: 106 mmol/L (ref 98–111)
Creatinine, Ser: 1.24 mg/dL (ref 0.61–1.24)
GFR, Estimated: 58 mL/min — ABNORMAL LOW (ref >60)
Glucose, Bld: 158 mg/dL — ABNORMAL HIGH (ref 70–99)
Potassium: 3.4 mmol/L — ABNORMAL LOW (ref 3.5–5.1)
Sodium: 137 mmol/L (ref 135–145)
Total Bilirubin: 2.7 mg/dL — ABNORMAL HIGH (ref 0.3–1.2)
Total Protein: 5.8 g/dL — ABNORMAL LOW (ref 6.5–8.1)

## 2021-02-17 LAB — MAGNESIUM: Magnesium: 1.7 mg/dL (ref 1.7–2.4)

## 2021-02-17 LAB — PHOSPHORUS: Phosphorus: 1.3 mg/dL — ABNORMAL LOW (ref 2.5–4.6)

## 2021-02-17 LAB — PROTIME-INR
INR: 1 (ref 0.8–1.2)
Prothrombin Time: 13.5 seconds (ref 11.4–15.2)

## 2021-02-17 LAB — LACTIC ACID, PLASMA
Lactic Acid, Venous: 2.6 mmol/L (ref 0.5–1.9)
Lactic Acid, Venous: 3.3 mmol/L (ref 0.5–1.9)
Lactic Acid, Venous: 3 mmol/L (ref 0.5–1.9)

## 2021-02-17 LAB — RESP PANEL BY RT-PCR (FLU A&B, COVID) ARPGX2
Influenza A by PCR: NEGATIVE
Influenza B by PCR: NEGATIVE
SARS Coronavirus 2 by RT PCR: NEGATIVE

## 2021-02-17 LAB — GLUCOSE, CAPILLARY
Glucose-Capillary: 140 mg/dL — ABNORMAL HIGH (ref 70–99)
Glucose-Capillary: 151 mg/dL — ABNORMAL HIGH (ref 70–99)

## 2021-02-17 LAB — VITAMIN B12: Vitamin B-12: 4600 pg/mL — ABNORMAL HIGH (ref 180–914)

## 2021-02-17 LAB — HEMOGLOBIN A1C
Hgb A1c MFr Bld: 6.8 % — ABNORMAL HIGH (ref 4.8–5.6)
Mean Plasma Glucose: 148.46 mg/dL

## 2021-02-17 LAB — APTT: aPTT: 30 seconds (ref 24–36)

## 2021-02-17 MED ORDER — SODIUM CHLORIDE 0.9 % IV SOLN
2.0000 g | Freq: Two times a day (BID) | INTRAVENOUS | Status: DC
  Administered 2021-02-18 (×3): 2 g via INTRAVENOUS
  Filled 2021-02-17 (×3): qty 2

## 2021-02-17 MED ORDER — POTASSIUM CHLORIDE 20 MEQ PO PACK
40.0000 meq | PACK | Freq: Once | ORAL | Status: AC
  Administered 2021-02-17: 40 meq via ORAL
  Filled 2021-02-17: qty 2

## 2021-02-17 MED ORDER — LACTATED RINGERS IV BOLUS (SEPSIS)
1000.0000 mL | Freq: Once | INTRAVENOUS | Status: AC
  Administered 2021-02-17: 1000 mL via INTRAVENOUS

## 2021-02-17 MED ORDER — SODIUM CHLORIDE 0.9 % IV BOLUS (SEPSIS): 500.0000 mL | Freq: Once | INTRAVENOUS | Status: DC

## 2021-02-17 MED ORDER — ASPIRIN EC 81 MG PO TBEC
81.0000 mg | DELAYED_RELEASE_TABLET | Freq: Every day | ORAL | Status: DC
  Administered 2021-02-18 – 2021-02-20 (×3): 81 mg via ORAL
  Filled 2021-02-17 (×3): qty 1

## 2021-02-17 MED ORDER — VANCOMYCIN HCL IN DEXTROSE 1-5 GM/200ML-% IV SOLN: 1000.0000 mg | Freq: Once | INTRAVENOUS | Status: DC

## 2021-02-17 MED ORDER — ONDANSETRON HCL 4 MG PO TABS: 4.0000 mg | ORAL_TABLET | Freq: Four times a day (QID) | ORAL | Status: DC | PRN

## 2021-02-17 MED ORDER — TRAMADOL HCL 50 MG PO TABS: 50.0000 mg | ORAL_TABLET | Freq: Two times a day (BID) | ORAL | Status: DC | PRN

## 2021-02-17 MED ORDER — ENOXAPARIN SODIUM 40 MG/0.4ML IJ SOSY
40.0000 mg | PREFILLED_SYRINGE | INTRAMUSCULAR | Status: DC
  Administered 2021-02-17 – 2021-02-19 (×3): 40 mg via SUBCUTANEOUS
  Filled 2021-02-17 (×3): qty 0.4

## 2021-02-17 MED ORDER — VANCOMYCIN HCL 1750 MG/350ML IV SOLN
1750.0000 mg | Freq: Once | INTRAVENOUS | Status: AC
  Administered 2021-02-17: 1750 mg via INTRAVENOUS
  Filled 2021-02-17: qty 350

## 2021-02-17 MED ORDER — METRONIDAZOLE 500 MG/100ML IV SOLN
500.0000 mg | Freq: Two times a day (BID) | INTRAVENOUS | Status: DC
  Administered 2021-02-17 – 2021-02-18 (×2): 500 mg via INTRAVENOUS
  Filled 2021-02-17 (×2): qty 100

## 2021-02-17 MED ORDER — LACTATED RINGERS IV SOLN: INTRAVENOUS | Status: AC

## 2021-02-17 MED ORDER — SODIUM CHLORIDE 0.9 % IV SOLN: 2.0000 g | Freq: Once | INTRAVENOUS | Status: DC

## 2021-02-17 MED ORDER — SODIUM CHLORIDE 0.9 % IV SOLN
2.0000 g | Freq: Once | INTRAVENOUS | Status: AC
  Administered 2021-02-17: 2 g via INTRAVENOUS
  Filled 2021-02-17: qty 2

## 2021-02-17 MED ORDER — SODIUM CHLORIDE 0.9 % IV BOLUS (SEPSIS): 1000.0000 mL | Freq: Once | INTRAVENOUS | Status: DC

## 2021-02-17 MED ORDER — ACETAMINOPHEN 325 MG PO TABS
650.0000 mg | ORAL_TABLET | Freq: Four times a day (QID) | ORAL | Status: DC | PRN
  Administered 2021-02-17 – 2021-02-18 (×2): 650 mg via ORAL
  Filled 2021-02-17 (×2): qty 2

## 2021-02-17 MED ORDER — LACTATED RINGERS IV BOLUS (SEPSIS)
1500.0000 mL | Freq: Once | INTRAVENOUS | Status: AC
  Administered 2021-02-17: 1500 mL via INTRAVENOUS

## 2021-02-17 MED ORDER — DOXAZOSIN MESYLATE 2 MG PO TABS
8.0000 mg | ORAL_TABLET | Freq: Every day | ORAL | Status: DC
  Administered 2021-02-17 – 2021-02-19 (×3): 8 mg via ORAL
  Filled 2021-02-17 (×3): qty 4

## 2021-02-17 MED ORDER — POLYETHYLENE GLYCOL 3350 17 G PO PACK: 17.0000 g | PACK | Freq: Every day | ORAL | Status: DC | PRN

## 2021-02-17 MED ORDER — METRONIDAZOLE 500 MG/100ML IV SOLN
500.0000 mg | Freq: Once | INTRAVENOUS | Status: AC
  Administered 2021-02-17: 500 mg via INTRAVENOUS
  Filled 2021-02-17: qty 100

## 2021-02-17 MED ORDER — ATORVASTATIN CALCIUM 20 MG PO TABS
20.0000 mg | ORAL_TABLET | Freq: Every day | ORAL | Status: DC
  Administered 2021-02-17 – 2021-02-19 (×3): 20 mg via ORAL
  Filled 2021-02-17 (×3): qty 1

## 2021-02-17 MED ORDER — INSULIN ASPART 100 UNIT/ML IJ SOLN
0.0000 [IU] | Freq: Three times a day (TID) | INTRAMUSCULAR | Status: DC
  Administered 2021-02-18 (×2): 2 [IU] via SUBCUTANEOUS
  Administered 2021-02-18 – 2021-02-19 (×3): 1 [IU] via SUBCUTANEOUS
  Administered 2021-02-19 – 2021-02-20 (×2): 2 [IU] via SUBCUTANEOUS
  Administered 2021-02-20: 1 [IU] via SUBCUTANEOUS

## 2021-02-17 MED ORDER — ONDANSETRON HCL 4 MG/2ML IJ SOLN: 4.0000 mg | Freq: Four times a day (QID) | INTRAMUSCULAR | Status: DC | PRN

## 2021-02-17 MED ORDER — DIAZEPAM 5 MG PO TABS
2.5000 mg | ORAL_TABLET | Freq: Two times a day (BID) | ORAL | Status: DC
  Administered 2021-02-17 – 2021-02-20 (×7): 2.5 mg via ORAL
  Filled 2021-02-17 (×7): qty 1

## 2021-02-17 MED ORDER — VANCOMYCIN HCL IN DEXTROSE 1-5 GM/200ML-% IV SOLN
1000.0000 mg | INTRAVENOUS | Status: DC
  Administered 2021-02-18: 1000 mg via INTRAVENOUS
  Filled 2021-02-17: qty 200

## 2021-02-17 MED ORDER — ACETAMINOPHEN 650 MG RE SUPP: 650.0000 mg | Freq: Four times a day (QID) | RECTAL | Status: DC | PRN

## 2021-02-17 NOTE — H&P (Addendum)
History and Physical:    Kyle Molina   XLK:[id number] DOB: Dec 28, 1940 DOA: 02/17/2021  Referring MD/provider: Meridee Score, MD PCP: Richmond Campbell., PA-C   Patient coming from: Home  Chief Complaint: Fever, generalized weakness  History of Present Illness:   Kyle Molina is a 81 y.o. male with medical history significant for diabetes mellitus, BPH, anxiety, hypertension, thrombocytopenia, overactive bladder, hyperbilirubinemia, who presented to the hospital because of generalized weakness and fever.  Reportedly, when EMS picked him up, his temperature was 103 F.  Patient said he has been feeling weak for the past few days.  He said he was unsteady on his feet and he was staggering at home.  He felt lightheaded.  He did not fall or lose consciousness.  No cough, rhinorrhea, sneezing, chest pain, shortness of breath, vomiting, diarrhea, abdominal pain or any urinary symptoms.  Of note, he was seen in the emergency room on 02/07/2021 for nausea and vomiting.  ED Course:  The patient was given IV fluids and empiric IV antibiotics (cefepime, vancomycin and Flagyl) for suspected sepsis.  ROS:   ROS all other systems reviewed were negative  Past Medical History:   Past Medical History:  Diagnosis Date   Anxiety    BPH (benign prostatic hyperplasia)    Diabetes mellitus without complication (HCC)    Hyperbilirubinemia    Hyperlipidemia    Hypertension    Overactive bladder    Thrombocytopenia (HCC)     Past Surgical History:   Past Surgical History:  Procedure Laterality Date   APPENDECTOMY     CATARACT EXTRACTION W/PHACO Left 12/02/2020   Procedure: CATARACT EXTRACTION PHACO AND INTRAOCULAR LENS PLACEMENT (IOC);  Surgeon: Fabio Pierce, MD;  Location: [facility];  Service: Ophthalmology;  Laterality: Left;  CDE 22.27   CATARACT EXTRACTION W/PHACO Right 12/27/2020   Procedure: CATARACT EXTRACTION PHACO AND INTRAOCULAR LENS PLACEMENT RIGHT EYE;  Surgeon: Fabio Pierce, MD;  Location: [facility];  Service: Ophthalmology;  Laterality: Right;  right CDE=13.12   COLONOSCOPY  2010?   Dr. Sabino Gasser. patient states he was "all clear"    Social History:   Social History   Socioeconomic History   Marital status: Married    Spouse name: Not on file   Number of children: Not on file   Years of education: Not on file   Highest education level: Not on file  Occupational History   Not on file  Tobacco Use   Smoking status: Never   Smokeless tobacco: Never  Vaping Use   Vaping Use: Never used  Substance and Sexual Activity   Alcohol use: No   Drug use: No   Sexual activity: Not on file  Other Topics Concern   Not on file  Social History Narrative   Not on file   Social Determinants of Health   Financial Resource Strain: Not on file  Food Insecurity: Not on file  Transportation Needs: Not on file  Physical Activity: Not on file  Stress: Not on file  Social Connections: Not on file  Intimate Partner Violence: Not on file    Allergies   Sulfa antibiotics  Family history:   History reviewed. No pertinent family history.  Current Medications:   Prior to Admission medications   Medication Sig Start Date End Date Taking? Authorizing Provider  aspirin 81 MG tablet Take 81 mg by mouth daily.   Yes [provider]  atorvastatin (LIPITOR) 20 MG tablet Take 20 mg by mouth at bedtime.  Yes [provider]  diazepam (VALIUM) 5 MG tablet Take 2.5 mg by mouth 2 (two) times daily.   Yes [provider]  doxazosin (CARDURA) 8 MG tablet Take 8 mg by mouth daily with supper. 09/18/20  Yes [provider]  metFORMIN (GLUCOPHAGE-XR) 500 MG 24 hr tablet Take 500 mg by mouth daily with supper. 08/29/19  Yes [provider]  traMADol (ULTRAM) 50 MG tablet Take 50 mg by mouth in the morning and at bedtime. 11/11/20  Yes [provider]  vitamin B-12 (CYANOCOBALAMIN) 1000 MCG tablet Take 1 tablet (1,000 mcg  total) by mouth daily. 09/27/19  Yes Barnetta Chapel, MD    Physical Exam:   Vitals:   02/17/21 1050 02/17/21 1050 02/17/21 1130 02/17/21 1343  BP:  (!) 108/49 (!) 114/56 (!) 144/69  Pulse:   84 84  Resp:   12 17  Temp: 98.1 F (36.7 C)   99.8 F (37.7 C)  TempSrc: Oral   Oral  SpO2:   97% 98%  Weight:      Height:         Physical Exam: Blood pressure (!) 144/69, pulse 84, temperature 99.8 F (37.7 C), temperature source Oral, resp. rate 17, height 5\' 7"  (1.702 m), weight 81.6 kg, SpO2 98 %. Gen: No acute distress. Head: Normocephalic, atraumatic. Eyes: Pupils equal, round and reactive to light. Extraocular movements intact.  Sclerae nonicteric.  Mouth: Dry mucous membranes Neck: Supple, no thyromegaly, no lymphadenopathy, no jugular venous distention. Chest: Lungs are clear to auscultation with good air movement. No rales, rhonchi or wheezes.  CV: Heart sounds are regular with an S1, S2. No murmurs, rubs or gallops.  Abdomen: Soft, nontender, nondistended with normal active bowel sounds. No palpable masses. Extremities: Extremities are without clubbing, or cyanosis. No edema. Pedal pulses 2+.  Skin: Warm and dry.  Diffuse erythematous maculopapular rash on the face, trunk, upper and lower extremities Neuro: Alert and oriented times 3; grossly nonfocal.  Psych: Insight is good and judgment is appropriate. Mood and affect normal.   Data Review:    Labs: Basic Metabolic Panel: Recent Labs  Lab 02/17/21 1103  NA 137  K 3.4*  CL 106  CO2 21*  GLUCOSE 158*  BUN 9  CREATININE 1.24  CALCIUM 8.1*   Liver Function Tests: Recent Labs  Lab 02/17/21 1103  AST 41  ALT 48*  ALKPHOS 118  BILITOT 2.7*  PROT 5.8*  ALBUMIN 2.9*   No results for input(s): LIPASE, AMYLASE in the last 168 hours. No results for input(s): AMMONIA in the last 168 hours. CBC: Recent Labs  Lab 02/17/21 1103  WBC 11.2*  NEUTROABS 10.1*  HGB 11.7*  HCT 35.2*  MCV 88.9  PLT 187    Cardiac Enzymes: No results for input(s): CKTOTAL, CKMB, CKMBINDEX, TROPONINI in the last 168 hours.  BNP (last 3 results) No results for input(s): PROBNP in the last 8760 hours. CBG: No results for input(s): GLUCAP in the last 168 hours.  Urinalysis    Component Value Date/Time   COLORURINE AMBER (A) 09/23/2019 1311   APPEARANCEUR CLEAR 09/23/2019 1311   LABSPEC 1.011 09/23/2019 1311   PHURINE 6.0 09/23/2019 1311   GLUCOSEU NEGATIVE 09/23/2019 1311   HGBUR MODERATE (A) 09/23/2019 1311   BILIRUBINUR NEGATIVE 09/23/2019 1311   KETONESUR NEGATIVE 09/23/2019 1311   PROTEINUR NEGATIVE 09/23/2019 1311   NITRITE NEGATIVE 09/23/2019 1311   LEUKOCYTESUR LARGE (A) 09/23/2019 1311      Radiographic Studies: DG  Chest Port 1 View  Result Date: 02/17/2021 CLINICAL DATA:  Sepsis EXAM: PORTABLE CHEST 1 VIEW COMPARISON:  Chest x-ray 09/23/2019 FINDINGS: Cardiomediastinal silhouette is stable and within normal limits. Stable chronic appearing prominent interstitial lung markings with no focal consolidation identified. No pleural effusion or pneumothorax. IMPRESSION: No acute intrathoracic process identified Electronically Signed   By: Jannifer Hickelaney  Williams M.D.   On: 02/17/2021 11:13    EKG: Independently reviewed by me.  Normal sinus rhythm, prolonged QTc interval-533, no acute ST-T changes   Assessment/Plan:   Principal Problem:   Fever    Body mass index is 28.19 kg/m.  Fever, suspected sepsis: Admit to telemetry.  Lactic acid was 2.6.  Continue IV fluids and empiric IV antibiotics.  Follow-up blood cultures.  No acute abnormality on chest x-ray.  Patient has not been able to produce a urine sample yet.  Generalized weakness: PT and OT evaluation  Hypokalemia: Replete potassium.  Check magnesium and phosphorus levels.  Diffuse maculopapular rash: Etiology is not clear but it could be from a viral exanthem.  He said he has not taken any new medications lately.  Elevated liver  enzymes: Improved  Anxiety: Continue diazepam  Type II DM: Hold metformin  Other information:   DVT prophylaxis:   Code Status: Full code. Family Communication: None Disposition Plan: Possible discharge to home in 2 to 3 days Consults called: None Admission status: Inpatient  The medical decision making on this patient was of high complexity and the patient is at high risk for clinical deterioration, therefore this is a level 3 visit.   Melissa Pulido [facility] Pager: Please check [email or web address]   How to contact the [facility] Attending or Consulting provider 7A - 7P or covering provider during after hours 7P -7A, for this patient?   Check the care team in [facility] and look for a) attending/consulting [facility] provider listed and b) the [facility] team listed Log into [email or web address] and use [facility] universal password to access. If you do not have the password, please contact the hospital operator. Locate the [facility] provider you are looking for under [facility] and page to a number that you can be directly reached. If you still have difficulty reaching the provider, please page the [facility] (Director on Call) for the Hospitalists listed on amion for assistance.  02/17/2021, 2:15 PM

## 2021-02-17 NOTE — Progress Notes (Signed)
Lab called with critical lactic acid of 3.3, up from 2.6. Dr. Mal Misty notified via Amion message.

## 2021-02-17 NOTE — Progress Notes (Signed)
Pharmacy Antibiotic Note  Kyle Molina is a 81 y.o. male admitted on 02/17/2021 with  unknown source of infection .  Pharmacy has been consulted for Vancomycin and Cefepime dosing.  Plan: Vancomycin 1750 mg IV x 1 dose. Vancomycin 1000 mg IV every 24 hours. Cefepime 2000 mg IV every 12 hours. Monitor labs, c/s, and vanco level as indicated.  Height: 5\' 7"  (170.2 cm) Weight: 81.6 kg (180 lb) IBW/kg (Calculated) : 66.1  Temp (24hrs), Avg:98.1 F (36.7 C), Min:98.1 F (36.7 C), Max:98.1 F (36.7 C)  Recent Labs  Lab 02/17/21 1103  WBC 11.2*  CREATININE 1.24  LATICACIDVEN 2.6*    Estimated Creatinine Clearance: 47 mL/min (by C-G formula based on SCr of 1.24 mg/dL).    Allergies  Allergen Reactions   Sulfa Antibiotics Itching    Antimicrobials this admission: Vanco 1/5 >> Cefepime 1/5 >> Flagyl 1/5  Microbiology results: 1/5 BCx: pending 1/5 UCx: pending    Thank you for allowing pharmacy to be a part of this patients care.  Margot Ables, PharmD Clinical Pharmacist 02/17/2021 12:07 PM

## 2021-02-17 NOTE — ED Notes (Addendum)
Date and time results received: 02/17/21 11:49 AM  (use smartphrase ".now" to insert current time)  Test: Lactic Acid Critical Value: 2.6  Name of Provider Notified: Charm Barges  Orders Received? Or Actions Taken?: see orders

## 2021-02-17 NOTE — Sepsis Progress Note (Signed)
eLink is monitoring this Code Sepsis. °

## 2021-02-17 NOTE — Sepsis Progress Note (Addendum)
Notified bedside nurse with request to inquire provider to order repeat lactic acid.  1645 MD has ordered repeat LA 1750 Secure chat with bedside to see if LA ever drawn. Provider entered it as a 'routine' lab.

## 2021-02-17 NOTE — ED Provider Notes (Signed)
[facility] EMERGENCY DEPARTMENT Provider Note   CSN: [id number] Arrival date & time: 02/17/21  1027     History  Chief Complaint  Patient presents with   Fever    Kyle Molina is a 81 y.o. male.  He is brought in by EMS for generalized weakness and fever.  He was here over a week ago for nausea vomiting and had elevated LFTs with negative imaging.  He felt he was doing okay over the last few days.  Today he was having difficulty getting up out of bed due to weakness.  Found to have a temperature of 103 by EMS and given fluids and Tylenol.  Also has a generalized rash that is new.  Patient states is not itchy and did not even know it was there.  Denies any headache cough shortness of breath abdominal pain vomiting diarrhea or urinary symptoms.  The history is provided by the patient and the EMS personnel.  Fever Max temp prior to arrival:  103 Temp source:  Temporal Onset quality:  Unable to specify Timing:  Unable to specify Progression:  Unchanged Chronicity:  New Associated symptoms: rash   Associated symptoms: no chest pain, no chills, no congestion, no cough, no diarrhea, no dysuria, no headaches, no myalgias, no nausea, no rhinorrhea, no sore throat and no vomiting   Rash:    Location:  Full body   Quality: redness     Severity:  Unable to specify   Onset quality:  Unable to specify   Timing:  Constant   Progression:  Unchanged Risk factors: recent sickness       Home Medications Prior to Admission medications   Medication Sig Start Date End Date Taking? Authorizing Provider  aspirin 81 MG tablet Take 81 mg by mouth daily.    [provider]  atorvastatin (LIPITOR) 20 MG tablet Take 20 mg by mouth at bedtime.    [provider]  diazepam (VALIUM) 5 MG tablet Take 2.5 mg by mouth 2 (two) times daily.    [provider]  doxazosin (CARDURA) 8 MG tablet Take 8 mg by mouth daily with supper. 09/18/20   [provider]  metFORMIN  (GLUCOPHAGE-XR) 500 MG 24 hr tablet Take 500 mg by mouth daily with supper. 08/29/19   [provider]  traMADol (ULTRAM) 50 MG tablet Take 50 mg by mouth in the morning and at bedtime. 11/11/20   [provider]  vitamin B-12 (CYANOCOBALAMIN) 1000 MCG tablet Take 1 tablet (1,000 mcg total) by mouth daily. 09/27/19   Bonnell Public, MD      Allergies    Sulfa antibiotics    Review of Systems   Review of Systems  Constitutional:  Positive for fever. Negative for chills.  HENT:  Negative for congestion, rhinorrhea and sore throat.   Eyes:  Negative for visual disturbance.  Respiratory:  Negative for cough and shortness of breath.   Cardiovascular:  Negative for chest pain.  Gastrointestinal:  Negative for abdominal pain, diarrhea, nausea and vomiting.  Genitourinary:  Negative for dysuria.  Musculoskeletal:  Negative for myalgias.  Skin:  Positive for rash.  Neurological:  Positive for weakness. Negative for headaches.   Physical Exam Updated Vital Signs Pulse 95    Resp 19    Ht 5\' 7"  (1.702 m)    Wt 81.6 kg    SpO2 96%    BMI 28.19 kg/m  Physical Exam Vitals and nursing note reviewed.  Constitutional:  General: He is not in acute distress.    Appearance: Normal appearance. He is well-developed.  HENT:     Head: Normocephalic and atraumatic.  Eyes:     Conjunctiva/sclera: Conjunctivae normal.  Cardiovascular:     Rate and Rhythm: Normal rate and regular rhythm.     Pulses: Normal pulses.     Heart sounds: No murmur heard. Pulmonary:     Effort: Pulmonary effort is normal. No respiratory distress.     Breath sounds: Normal breath sounds.  Abdominal:     Palpations: Abdomen is soft.     Tenderness: There is no abdominal tenderness.  Musculoskeletal:        General: No swelling.     Cervical back: Neck supple.  Skin:    General: Skin is warm and dry.     Capillary Refill: Capillary refill takes less than 2 seconds.     Findings: Rash present.      Comments: Has a full body rash of red lesions approximately 3 mm.  Not itchy.  Blanches  Neurological:     General: No focal deficit present.     Mental Status: He is alert.  Psychiatric:        Mood and Affect: Mood normal.     ED Results / Procedures / Treatments   Labs (all labs ordered are listed, but only abnormal results are displayed) Labs Reviewed  LACTIC ACID, PLASMA - Abnormal; Notable for the following components:      Result Value   Lactic Acid, Venous 2.6 (*)    All other components within normal limits  LACTIC ACID, PLASMA - Abnormal; Notable for the following components:   Lactic Acid, Venous 3.3 (*)    All other components within normal limits  COMPREHENSIVE METABOLIC PANEL - Abnormal; Notable for the following components:   Potassium 3.4 (*)    CO2 21 (*)    Glucose, Bld 158 (*)    Calcium 8.1 (*)    Total Protein 5.8 (*)    Albumin 2.9 (*)    ALT 48 (*)    Total Bilirubin 2.7 (*)    GFR, Estimated 58 (*)    All other components within normal limits  CBC WITH DIFFERENTIAL/PLATELET - Abnormal; Notable for the following components:   WBC 11.2 (*)    RBC 3.96 (*)    Hemoglobin 11.7 (*)    HCT 35.2 (*)    Neutro Abs 10.1 (*)    Lymphs Abs 0.4 (*)    All other components within normal limits  URINALYSIS, ROUTINE W REFLEX MICROSCOPIC - Abnormal; Notable for the following components:   APPearance CLOUDY (*)    Hgb urine dipstick MODERATE (*)    Protein, ur 30 (*)    Leukocytes,Ua LARGE (*)    WBC, UA >50 (*)    Bacteria, UA FEW (*)    All other components within normal limits  PHOSPHORUS - Abnormal; Notable for the following components:   Phosphorus 1.3 (*)    All other components within normal limits  VITAMIN B12 - Abnormal; Notable for the following components:   Vitamin B-12 4,600 (*)    All other components within normal limits  GLUCOSE, CAPILLARY - Abnormal; Notable for the following components:   Glucose-Capillary 140 (*)    All other components  within normal limits  CULTURE, BLOOD (ROUTINE X 2)  CULTURE, BLOOD (ROUTINE X 2)  RESP PANEL BY RT-PCR (FLU A&B, COVID) ARPGX2  URINE CULTURE  PROTIME-INR  APTT  MAGNESIUM  HEMOGLOBIN A1C  LACTIC ACID, PLASMA  CREATININE, SERUM  PROTIME-INR  CORTISOL-AM, BLOOD  PROCALCITONIN    EKG EKG Interpretation  Date/Time:  Thursday February 17 2021 10:57:03 EST Ventricular Rate:  90 PR Interval:  119 QRS Duration: 117 QT Interval:  435 QTC Calculation: 533 R Axis:   48 Text Interpretation: Sinus rhythm Borderline short PR interval Nonspecific intraventricular conduction delay Minimal ST depression Baseline wander in lead(s) V1 V5 V6 improved ischemic changes on prior 12/22 Confirmed by Aletta Edouard [phone]) on 02/17/2021 11:01:42 AM  Radiology DG Chest Port 1 View  Result Date: 02/17/2021 CLINICAL DATA:  Sepsis EXAM: PORTABLE CHEST 1 VIEW COMPARISON:  Chest x-ray 09/23/2019 FINDINGS: Cardiomediastinal silhouette is stable and within normal limits. Stable chronic appearing prominent interstitial lung markings with no focal consolidation identified. No pleural effusion or pneumothorax. IMPRESSION: No acute intrathoracic process identified Electronically Signed   By: Ofilia Neas M.D.   On: 02/17/2021 11:13    Procedures .Critical Care Performed by: Hayden Rasmussen, MD Authorized by: Hayden Rasmussen, MD   Critical care provider statement:    Critical care time (minutes):  45   Critical care time was exclusive of:  Separately billable procedures and treating other patients   Critical care was necessary to treat or prevent imminent or life-threatening deterioration of the following conditions:  Sepsis   Critical care was time spent personally by me on the following activities:  Development of treatment plan with patient or surrogate, discussions with consultants, evaluation of patient's response to treatment, examination of patient, obtaining history from patient or surrogate,  ordering and performing treatments and interventions, ordering and review of laboratory studies, ordering and review of radiographic studies, pulse oximetry, re-evaluation of patient's condition and review of old charts   I assumed direction of critical care for this patient from another provider in my specialty: no      Medications Ordered in ED Medications  lactated ringers infusion ( Intravenous Infusion Verify 02/17/21 1816)  ceFEPIme (MAXIPIME) 2 g in sodium chloride 0.9 % 100 mL IVPB (has no administration in time range)  vancomycin (VANCOCIN) IVPB 1000 mg/200 mL premix (has no administration in time range)  aspirin EC tablet 81 mg (has no administration in time range)  traMADol (ULTRAM) tablet 50 mg (has no administration in time range)  atorvastatin (LIPITOR) tablet 20 mg (has no administration in time range)  doxazosin (CARDURA) tablet 8 mg (8 mg Oral Given 02/17/21 1704)  diazepam (VALIUM) tablet 2.5 mg (2.5 mg Oral Given 02/17/21 1705)  enoxaparin (LOVENOX) injection 40 mg (40 mg Subcutaneous Given 02/17/21 1704)  metroNIDAZOLE (FLAGYL) IVPB 500 mg (has no administration in time range)  acetaminophen (TYLENOL) tablet 650 mg (650 mg Oral Given 02/17/21 1711)    Or  acetaminophen (TYLENOL) suppository 650 mg ( Rectal See Alternative 02/17/21 1711)  polyethylene glycol (MIRALAX / GLYCOLAX) packet 17 g (has no administration in time range)  ondansetron (ZOFRAN) tablet 4 mg (has no administration in time range)    Or  ondansetron (ZOFRAN) injection 4 mg (has no administration in time range)  insulin aspart (novoLOG) injection 0-9 Units (has no administration in time range)  lactated ringers bolus 1,000 mL (0 mLs Intravenous Stopped 02/17/21 1230)  lactated ringers bolus 1,500 mL (1,500 mLs Intravenous New Bag/Given 02/17/21 1205)  ceFEPIme (MAXIPIME) 2 g in sodium chloride 0.9 % 100 mL IVPB (0 g Intravenous Stopped 02/17/21 1251)  metroNIDAZOLE (FLAGYL) IVPB 500 mg (500 mg Intravenous New Bag/Given  02/17/21 1240)  vancomycin (VANCOREADY) IVPB 1750 mg/350 mL (1,750 mg Intravenous New Bag/Given 02/17/21 1328)  potassium chloride (KLOR-CON) packet 40 mEq (40 mEq Oral Given 02/17/21 1705)    ED Course/ Medical Decision Making/ A&P Clinical Course as of 02/17/21 1851  Thu Feb 17, 2021  1312 Discussed with [facility] hospitalist Dr. Mal Misty who will evaluate the patient for admission. [MB]    Clinical Course User Index [MB] Hayden Rasmussen, MD                           Medical Decision Making  This patient complains of neurolysed weakness and rash fever; this involves an extensive number of treatment Options and is a complaint that carries with it a high risk of complications and Morbidity. The differential includes sepsis, Sirs, infectious rash, dehydration, metabolic derangement, pneumonia, COVID, flu, UTI  I ordered, reviewed and interpreted labs, which included CBC with elevated white count, hemoglobin low stable from priors, chemistries with low bicarb.  LFTs not normal but improved from priors, blood culture sent, COVID and flu negative, lactate elevated I ordered medication IV fluids IV antibiotics I ordered imaging studies which included chest x-ray and I independently    visualized and interpreted imaging which showed no acute infiltrate Additional history obtained from patient's wife and EMS Previous records obtained and reviewed in epic including prior ED visit last week. I consulted [facility] hospitalist Dr.Ayiku And discussed lab and imaging findings  Critical Interventions: Work-up and management of patient's sepsis with goal directed fluids and antibiotics  After the interventions stated above, I reevaluated the patient and found patient to be symptomatically improved.  He will need to be admitted to the hospital for further management and patient in agreement with plan.        Final Clinical Impression(s) / ED Diagnoses Final diagnoses:  Sepsis, due to unspecified organism,  unspecified whether acute organ dysfunction present [facility])  Generalized weakness  Rash and nonspecific skin eruption    Rx / DC Orders ED Discharge Orders     None         Hayden Rasmussen, MD 02/17/21 [phone]

## 2021-02-17 NOTE — Sepsis Progress Note (Signed)
Notified provider of need to order another repeat lactic acid as the second was higher than the first. ° °

## 2021-02-17 NOTE — ED Triage Notes (Signed)
Pt arrives via [facility] for c/o a fever and generalized weakness.

## 2021-02-18 LAB — BASIC METABOLIC PANEL
Anion gap: 8 (ref 5–15)
BUN: 9 mg/dL (ref 8–23)
CO2: 24 mmol/L (ref 22–32)
Calcium: 8.2 mg/dL — ABNORMAL LOW (ref 8.9–10.3)
Chloride: 110 mmol/L (ref 98–111)
Creatinine, Ser: 1.05 mg/dL (ref 0.61–1.24)
GFR, Estimated: >60 mL/min (ref >60)
Glucose, Bld: 136 mg/dL — ABNORMAL HIGH (ref 70–99)
Potassium: 3.4 mmol/L — ABNORMAL LOW (ref 3.5–5.1)
Sodium: 142 mmol/L (ref 135–145)

## 2021-02-18 LAB — GLUCOSE, CAPILLARY
Glucose-Capillary: 129 mg/dL — ABNORMAL HIGH (ref 70–99)
Glucose-Capillary: 147 mg/dL — ABNORMAL HIGH (ref 70–99)
Glucose-Capillary: 155 mg/dL — ABNORMAL HIGH (ref 70–99)
Glucose-Capillary: 176 mg/dL — ABNORMAL HIGH (ref 70–99)

## 2021-02-18 LAB — CREATININE, SERUM
Creatinine, Ser: 1.03 mg/dL (ref 0.61–1.24)
GFR, Estimated: >60 mL/min (ref >60)

## 2021-02-18 LAB — PROTIME-INR
INR: 1.2 (ref 0.8–1.2)
Prothrombin Time: 15 seconds (ref 11.4–15.2)

## 2021-02-18 LAB — MRSA NEXT GEN BY PCR, NASAL: MRSA by PCR Next Gen: NOT DETECTED

## 2021-02-18 LAB — CORTISOL-AM, BLOOD: Cortisol - AM: 19.2 ug/dL (ref 6.7–22.6)

## 2021-02-18 LAB — PROCALCITONIN: Procalcitonin: 0.81 ng/mL

## 2021-02-18 LAB — LACTIC ACID, PLASMA: Lactic Acid, Venous: 1.3 mmol/L (ref 0.5–1.9)

## 2021-02-18 MED ORDER — POTASSIUM PHOSPHATES 15 MMOLE/5ML IV SOLN
30.0000 mmol | Freq: Once | INTRAVENOUS | Status: AC
  Administered 2021-02-18: 30 mmol via INTRAVENOUS
  Filled 2021-02-18: qty 10

## 2021-02-18 NOTE — Progress Notes (Addendum)
Progress Note    Kyle Molina  UDJ:[id number] DOB: Dec 23, 1940  DOA: 02/17/2021 PCP: Richmond Campbell., PA-C      Brief Narrative:    Medical records reviewed and are as summarized below:  Kyle Molina is a 81 y.o. male with medical history significant for diabetes mellitus, BPH, anxiety, hypertension, thrombocytopenia, overactive bladder, hyperbilirubinemia, who presented to the hospital because of generalized weakness and fever.  Reportedly, when EMS picked him up, his temperature was 103 F.  Patient said he has been feeling weak for the past few days.  He said he was unsteady on his feet and he was staggering at home.  He felt lightheaded.  He did not fall or lose consciousness.  No cough, rhinorrhea, sneezing, chest pain, shortness of breath, vomiting, diarrhea, abdominal pain or any urinary symptoms.  Of note, he was seen in the emergency room on 02/07/2021 for nausea and vomiting.      Assessment/Plan:   Principal Problem:   Fever    Body mass index is 28.19 kg/m.   Fever, sepsis likely from acute UTI: Urine culture is growing gram-negative rods.  No growth on blood cultures thus far.  Continue IV cefepime.  Discontinue vancomycin and Flagyl.  Discontinue IV fluids.  Generalized weakness: Consult PT and OT  Hypokalemia and hypophosphatemia: Replete potassium and phosphorus.    Diffuse maculopapular rash: Etiology is not clear.  No itching  Elevated liver enzymes: Improved   Anxiety: Continue diazepam  Type II DM: Hold metformin  Diet Order             Diet Carb Modified Fluid consistency: Thin; Room service appropriate? Yes  Diet effective now                      Consultants: None  Procedures: None    Medications:    aspirin EC  81 mg Oral Daily   atorvastatin  20 mg Oral QHS   diazepam  2.5 mg Oral BID   doxazosin  8 mg Oral Q supper   enoxaparin (LOVENOX) injection  40 mg Subcutaneous Q24H   insulin aspart  0-9 Units  Subcutaneous TID WC   Continuous Infusions:  ceFEPime (MAXIPIME) IV 2 g (02/18/21 1121)   potassium PHOSPHATE IVPB (in mmol)       Anti-infectives (From admission, onward)    Start     Dose/Rate Route Frequency Ordered Stop   02/18/21 1300  vancomycin (VANCOCIN) IVPB 1000 mg/200 mL premix  Status:  Discontinued        1,000 mg 200 mL/hr over 60 Minutes Intravenous Every 24 hours 02/17/21 1206 02/18/21 1402   02/18/21 0000  ceFEPIme (MAXIPIME) 2 g in sodium chloride 0.9 % 100 mL IVPB        2 g 200 mL/hr over 30 Minutes Intravenous Every 12 hours 02/17/21 1203     02/17/21 2200  metroNIDAZOLE (FLAGYL) IVPB 500 mg  Status:  Discontinued        500 mg 100 mL/hr over 60 Minutes Intravenous Every 12 hours 02/17/21 1432 02/18/21 1402   02/17/21 1530  ceFEPIme (MAXIPIME) 2 g in sodium chloride 0.9 % 100 mL IVPB  Status:  Discontinued        2 g 200 mL/hr over 30 Minutes Intravenous  Once 02/17/21 1432 02/17/21 1434   02/17/21 1530  vancomycin (VANCOCIN) IVPB 1000 mg/200 mL premix  Status:  Discontinued        1,000 mg 200 mL/hr over  60 Minutes Intravenous  Once 02/17/21 1432 02/17/21 1434   02/17/21 1200  ceFEPIme (MAXIPIME) 2 g in sodium chloride 0.9 % 100 mL IVPB        2 g 200 mL/hr over 30 Minutes Intravenous  Once 02/17/21 1152 02/17/21 1251   02/17/21 1200  metroNIDAZOLE (FLAGYL) IVPB 500 mg        500 mg 100 mL/hr over 60 Minutes Intravenous  Once 02/17/21 1152 02/17/21 1340   02/17/21 1200  vancomycin (VANCOCIN) IVPB 1000 mg/200 mL premix  Status:  Discontinued        1,000 mg 200 mL/hr over 60 Minutes Intravenous  Once 02/17/21 1152 02/17/21 1155   02/17/21 1200  vancomycin (VANCOREADY) IVPB 1750 mg/350 mL        1,750 mg 175 mL/hr over 120 Minutes Intravenous  Once 02/17/21 1155 02/17/21 1528              Family Communication/Anticipated D/C date and plan/Code Status   DVT prophylaxis: enoxaparin (LOVENOX) injection 40 mg Start: 02/17/21 1530 SCDs Start:  02/17/21 1431     Code Status: Full Code  Family Communication: None Disposition Plan: Possible discharge to home in 2 to 3 days   Status is: Inpatient  Remains inpatient appropriate because: IV antibiotics           Subjective:   Interval events noted.  He still has a rash but no itching.  No chest pain, shortness of breath, abdominal pain, nausea, vomiting or diarrhea.  No urinary symptoms.  He feels a little better compared to yesterday  Objective:    Vitals:   02/17/21 2148 02/18/21 0205 02/18/21 0557 02/18/21 1242  BP: (!) 120/53 124/64 125/60 (!) 125/53  Pulse: 74 71 79 81  Resp: 20 18 18 17   Temp: 98.1 F (36.7 C) 98 F (36.7 C) 98.6 F (37 C) 98.6 F (37 C)  TempSrc:  Oral  Oral  SpO2: 96% 97% 98% 99%  Weight:      Height:       No data found.   Intake/Output Summary (Last 24 hours) at 02/18/2021 1409 Last data filed at 02/18/2021 0900 Gross per 24 hour  Intake 2822.29 ml  Output 2850 ml  Net -27.71 ml   Filed Weights   02/17/21 1039  Weight: 81.6 kg    Exam:  GEN: NAD SKIN: Diffuse maculopapular rash on the trunk, upper and lower extremities EYES: EOMI ENT: MMM CV: RRR PULM: CTA B ABD: soft, ND, NT, +BS CNS: AAO x 3, non focal EXT: No edema or tenderness             Data Reviewed:   I have personally reviewed following labs and imaging studies:  Labs: Labs show the following:   Basic Metabolic Panel: Recent Labs  Lab 02/17/21 1102 02/17/21 1103 02/18/21 0522  NA  --  137 142  K  --  3.4* 3.4*  CL  --  106 110  CO2  --  21* 24  GLUCOSE  --  158* 136*  BUN  --  9 9  CREATININE  --  1.24 1.05   1.03  CALCIUM  --  8.1* 8.2*  MG 1.7  --   --   PHOS 1.3*  --   --    GFR Estimated Creatinine Clearance: 56.5 mL/min (by C-G formula based on SCr of 1.03 mg/dL). Liver Function Tests: Recent Labs  Lab 02/17/21 1103  AST 41  ALT 48*  ALKPHOS 118  BILITOT  2.7*  PROT 5.8*  ALBUMIN 2.9*   No results for  input(s): LIPASE, AMYLASE in the last 168 hours. No results for input(s): AMMONIA in the last 168 hours. Coagulation profile Recent Labs  Lab 02/17/21 1103 02/18/21 0522  INR 1.0 1.2    CBC: Recent Labs  Lab 02/17/21 1103  WBC 11.2*  NEUTROABS 10.1*  HGB 11.7*  HCT 35.2*  MCV 88.9  PLT 187   Cardiac Enzymes: No results for input(s): CKTOTAL, CKMB, CKMBINDEX, TROPONINI in the last 168 hours. BNP (last 3 results) No results for input(s): PROBNP in the last 8760 hours. CBG: Recent Labs  Lab 02/17/21 1659 02/17/21 2150 02/18/21 0736 02/18/21 1114  GLUCAP 140* 151* 129* 147*   D-Dimer: No results for input(s): DDIMER in the last 72 hours. Hgb A1c: Recent Labs    02/17/21 1103  HGBA1C 6.8*   Lipid Profile: No results for input(s): CHOL, HDL, LDLCALC, TRIG, CHOLHDL, LDLDIRECT in the last 72 hours. Thyroid function studies: No results for input(s): TSH, T4TOTAL, T3FREE, THYROIDAB in the last 72 hours.  Invalid input(s): FREET3 Anemia work up: Recent Labs    02/17/21 1102  VITAMINB12 4,600*   Sepsis Labs: Recent Labs  Lab 02/17/21 1103 02/17/21 1308 02/17/21 1736 02/18/21 0522 02/18/21 0807  PROCALCITON  --   --   --  0.81  --   WBC 11.2*  --   --   --   --   LATICACIDVEN 2.6* 3.3* 3.0*  --  1.3    Microbiology Recent Results (from the past 240 hour(s))  Urine Culture     Status: Abnormal (Preliminary result)   Collection Time: 02/17/21 10:43 AM   Specimen: In/Out Cath Urine  Result Value Ref Range Status   Specimen Description   Final    IN/OUT CATH URINE Performed at [facility], [address] [place], [place] [address]    Special Requests   Final    NONE Performed at [facility], [address] [place], [place] [address]    Culture (A)  Final    >=100,000 COLONIES/mL GRAM NEGATIVE RODS IDENTIFICATION AND SUSCEPTIBILITIES TO FOLLOW Performed at [facility], [address] [place], [place] [address]    Report Status PENDING   Incomplete  Resp Panel by RT-PCR (Flu A&B, Covid) Urine, Clean Catch     Status: None   Collection Time: 02/17/21 10:50 AM   Specimen: Urine, Clean Catch; Nasopharyngeal(NP) swabs in vial transport medium  Result Value Ref Range Status   SARS Coronavirus 2 by RT PCR NEGATIVE NEGATIVE Final    Comment: (NOTE) SARS-CoV-2 target nucleic acids are NOT DETECTED.  The SARS-CoV-2 RNA is generally detectable in upper respiratory specimens during the acute phase of infection. The lowest concentration of SARS-CoV-2 viral copies this assay can detect is 138 copies/mL. A negative result does not preclude SARS-Cov-2 infection and should not be used as the sole basis for treatment or other patient management decisions. A negative result may occur with  improper specimen collection/handling, submission of specimen other than nasopharyngeal swab, presence of viral mutation(s) within the areas targeted by this assay, and inadequate number of viral copies(<138 copies/mL). A negative result must be combined with clinical observations, patient history, and epidemiological information. The expected result is Negative.  Fact Sheet for Patients:  BloggerCourse.comhttps://www.fda.gov/media/152166/download  Fact Sheet for Healthcare Providers:  SeriousBroker.ithttps://www.fda.gov/media/152162/download  This test is no t yet approved or cleared by the [place] FDA and  has been authorized for detection and/or diagnosis  of SARS-CoV-2 by FDA under an Emergency Use Authorization (EUA). This EUA will remain  in effect (meaning this test can be used) for the duration of the COVID-19 declaration under Section 564(b)(1) of the Act, 21 U.S.C.section 360bbb-3(b)(1), unless the authorization is terminated  or revoked sooner.       Influenza A by PCR NEGATIVE NEGATIVE Final   Influenza B by PCR NEGATIVE NEGATIVE Final    Comment: (NOTE) The Xpert Xpress SARS-CoV-2/FLU/RSV plus assay is intended as an aid in the diagnosis of influenza from  Nasopharyngeal swab specimens and should not be used as a sole basis for treatment. Nasal washings and aspirates are unacceptable for Xpert Xpress SARS-CoV-2/FLU/RSV testing.  Fact Sheet for Patients: BloggerCourse.comhttps://www.fda.gov/media/152166/download  Fact Sheet for Healthcare Providers: SeriousBroker.ithttps://www.fda.gov/media/152162/download  This test is not yet approved or cleared by the [place] FDA and has been authorized for detection and/or diagnosis of SARS-CoV-2 by FDA under an Emergency Use Authorization (EUA). This EUA will remain in effect (meaning this test can be used) for the duration of the COVID-19 declaration under Section 564(b)(1) of the Act, 21 U.S.C. section 360bbb-3(b)(1), unless the authorization is terminated or revoked.  Performed at [facility], [address] [place], [place] [address]   Blood Culture (routine x 2)     Status: None (Preliminary result)   Collection Time: 02/17/21 11:03 AM   Specimen: BLOOD LEFT FOREARM  Result Value Ref Range Status   Specimen Description BLOOD LEFT FOREARM  Final   Special Requests   Final    BOTTLES DRAWN AEROBIC AND ANAEROBIC Blood Culture adequate volume   Culture   Final    NO GROWTH < 24 HOURS Performed at [facility], [address] [place], [place] [address]    Report Status PENDING  Incomplete  Blood Culture (routine x 2)     Status: None (Preliminary result)   Collection Time: 02/17/21 11:39 AM   Specimen: BLOOD LEFT HAND  Result Value Ref Range Status   Specimen Description BLOOD LEFT HAND  Final   Special Requests   Final    BOTTLES DRAWN AEROBIC AND ANAEROBIC Blood Culture results may not be optimal due to an excessive volume of blood received in culture bottles   Culture   Final    NO GROWTH < 24 HOURS Performed at [facility], [address] [place], [place] [address]    Report Status PENDING  Incomplete    Procedures and diagnostic studies:  DG Chest Port 1 View  Result Date: 02/17/2021 CLINICAL DATA:   Sepsis EXAM: PORTABLE CHEST 1 VIEW COMPARISON:  Chest x-ray 09/23/2019 FINDINGS: Cardiomediastinal silhouette is stable and within normal limits. Stable chronic appearing prominent interstitial lung markings with no focal consolidation identified. No pleural effusion or pneumothorax. IMPRESSION: No acute intrathoracic process identified Electronically Signed   By: Jannifer Hickelaney  Williams M.D.   On: 02/17/2021 11:13               LOS: 1 day   Kyle Molina  [facility]   Pager on [email or web address]. If 7PM-7AM, please contact night-coverage at [email or web address]     02/18/2021, 2:09 PM

## 2021-02-18 NOTE — Care Management Important Message (Signed)
Important Message  Patient Details  Name: Kyle Molina MRN: [id number] Date of Birth: April 11, 1940   Medicare Important Message Given:  Yes     Corey Harold 02/18/2021, 3:26 PM

## 2021-02-19 DIAGNOSIS — A419 Sepsis, unspecified organism: Secondary | ICD-10-CM | POA: Diagnosis present

## 2021-02-19 LAB — BASIC METABOLIC PANEL
Anion gap: 7 (ref 5–15)
BUN: 11 mg/dL (ref 8–23)
CO2: 25 mmol/L (ref 22–32)
Calcium: 8.1 mg/dL — ABNORMAL LOW (ref 8.9–10.3)
Chloride: 112 mmol/L — ABNORMAL HIGH (ref 98–111)
Creatinine, Ser: 1.03 mg/dL (ref 0.61–1.24)
GFR, Estimated: >60 mL/min (ref >60)
Glucose, Bld: 132 mg/dL — ABNORMAL HIGH (ref 70–99)
Potassium: 3.5 mmol/L (ref 3.5–5.1)
Sodium: 144 mmol/L (ref 135–145)

## 2021-02-19 LAB — GLUCOSE, CAPILLARY
Glucose-Capillary: 128 mg/dL — ABNORMAL HIGH (ref 70–99)
Glucose-Capillary: 163 mg/dL — ABNORMAL HIGH (ref 70–99)
Glucose-Capillary: 139 mg/dL — ABNORMAL HIGH (ref 70–99)
Glucose-Capillary: 161 mg/dL — ABNORMAL HIGH (ref 70–99)

## 2021-02-19 LAB — CBC WITH DIFFERENTIAL/PLATELET
Abs Immature Granulocytes: 0.03 10*3/uL (ref 0.00–0.07)
Basophils Absolute: 0 10*3/uL (ref 0.0–0.1)
Basophils Relative: 1 %
Eosinophils Absolute: 0.2 10*3/uL (ref 0.0–0.5)
Eosinophils Relative: 2 %
HCT: 31.4 % — ABNORMAL LOW (ref 39.0–52.0)
Hemoglobin: 10.1 g/dL — ABNORMAL LOW (ref 13.0–17.0)
Immature Granulocytes: 0 %
Lymphocytes Relative: 13 %
Lymphs Abs: 0.9 10*3/uL (ref 0.7–4.0)
MCH: 28.9 pg (ref 26.0–34.0)
MCHC: 32.2 g/dL (ref 30.0–36.0)
MCV: 89.7 fL (ref 80.0–100.0)
Monocytes Absolute: 0.6 10*3/uL (ref 0.1–1.0)
Monocytes Relative: 8 %
Neutro Abs: 5.3 10*3/uL (ref 1.7–7.7)
Neutrophils Relative %: 76 %
Platelets: 164 10*3/uL (ref 150–400)
RBC: 3.5 MIL/uL — ABNORMAL LOW (ref 4.22–5.81)
RDW: 14.4 % (ref 11.5–15.5)
WBC: 7 10*3/uL (ref 4.0–10.5)
nRBC: 0 % (ref 0.0–0.2)

## 2021-02-19 LAB — MAGNESIUM: Magnesium: 2 mg/dL (ref 1.7–2.4)

## 2021-02-19 LAB — PHOSPHORUS: Phosphorus: 3 mg/dL (ref 2.5–4.6)

## 2021-02-19 MED ORDER — POTASSIUM CHLORIDE CRYS ER 20 MEQ PO TBCR
40.0000 meq | EXTENDED_RELEASE_TABLET | Freq: Once | ORAL | Status: AC
  Administered 2021-02-19: 40 meq via ORAL
  Filled 2021-02-19: qty 2

## 2021-02-19 MED ORDER — SODIUM CHLORIDE 0.9 % IV SOLN
2.0000 g | INTRAVENOUS | Status: DC
  Administered 2021-02-19: 2 g via INTRAVENOUS
  Filled 2021-02-19: qty 20

## 2021-02-19 NOTE — Evaluation (Signed)
Physical Therapy Evaluation Patient Details Name: Kyle Molina MRN: [id number] DOB: 1940-05-28 Today's Date: 02/19/2021  History of Present Illness  Kyle Molina is a 81 y.o. male with medical history significant for diabetes mellitus, BPH, anxiety, hypertension, thrombocytopenia, overactive bladder, hyperbilirubinemia, who presented to the hospital because of generalized weakness and fever.  Reportedly, when EMS picked him up, his temperature was 103 F.  Patient said he has been feeling weak for the past few days.  He said he was unsteady on his feet and he was staggering at home.  He felt lightheaded.  He did not fall or lose consciousness.  No cough, rhinorrhea, sneezing, chest pain, shortness of breath, vomiting, diarrhea, abdominal pain or any urinary symptoms.  Of note, he was seen in the emergency room on 02/07/2021 for nausea and vomiting.   Clinical Impression  Pt admitted with above diagnosis. Wife present. Patient agreeable to participating in PT evaluation today. Patient performing near baseline. He does require one hand held or SPC to steady during transfers and ambulation but was able to ambulate 250 feet. When attempting to ambulate without assistive device, patient does drift to right when looking to right and reaches for objects to steady himself. Patient does exhibit fairly short but equal length steps and a steady cadence with fair overall posturing.  Pt currently with functional limitations due to the deficits listed below (see PT Problem List). Pt will benefit from skilled PT to increase their independence and safety with mobility to allow discharge to the venue listed below.           Recommendations for follow up therapy are one component of a multi-disciplinary discharge planning process, led by the attending physician.  Recommendations may be updated based on patient status, additional functional criteria and insurance authorization.  Follow Up Recommendations No PT  follow up    Assistance Recommended at Discharge Set up Supervision/Assistance  Patient can return home with the following  A little help with walking and/or transfers;A little help with bathing/dressing/bathroom;Assistance with cooking/housework;Assist for transportation    Equipment Recommendations None recommended by PT  Recommendations for Other Services       Functional Status Assessment Patient has had a recent decline in their functional status and demonstrates the ability to make significant improvements in function in a reasonable and predictable amount of time.     Precautions / Restrictions Precautions Precautions: Fall Precaution Comments: patient reports no falls in the lat i months Restrictions Weight Bearing Restrictions: No      Mobility  Bed Mobility Overal bed mobility: Modified Independent             General bed mobility comments: inreased time    Transfers Overall transfer level: Needs assistance Equipment used: None Transfers: Sit to/from Stand Sit to Stand: Supervision           General transfer comment: somewhat unsteady upon initial standing    Ambulation/Gait Ambulation/Gait assistance: Min guard Gait Distance (Feet): 275 Feet Assistive device: None Gait Pattern/deviations: Step-through pattern;Decreased step length - right;Decreased step length - left;Decreased stride length;Drifts right/left Gait velocity: decreased     General Gait Details: somewhat slow cadence without assistive device, on room air, drifting left and right when looking to the right, limited by fatigue  Stairs            Wheelchair Mobility    Modified Rankin (Stroke Patients Only)       Balance Overall balance assessment: Needs assistance Sitting-balance support: No upper extremity supported;Feet supported  Sitting balance-Leahy Scale: Good     Standing balance support: Single extremity supported;During functional activity Standing  balance-Leahy Scale: Fair Standing balance comment: fair/good with SPC             Pertinent Vitals/Pain Pain Assessment: No/denies pain    Home Living Family/patient expects to be discharged to:: Private residence Living Arrangements: Spouse/significant other Available Help at Discharge: Family;Available 24 hours/day Type of Home: Mobile home Home Access: Stairs to enter Entrance Stairs-Rails: None Entrance Stairs-Number of Steps: 1   Home Layout: One level Home Equipment: Cane - quad;Cane - single point      Prior Function Prior Level of Function : Independent/Modified Independent   Mobility Comments: at baseline patient used SPC in house and in community; wife drives mostly; able to use cart to walk around grocery store       Hand Dominance   Dominant Hand: Right    Extremity/Trunk Assessment   Upper Extremity Assessment Upper Extremity Assessment: Overall WFL for tasks assessed    Lower Extremity Assessment Lower Extremity Assessment: Overall WFL for tasks assessed    Cervical / Trunk Assessment Cervical / Trunk Assessment: Kyphotic  Communication   Communication: No difficulties  Cognition Arousal/Alertness: Awake/alert Behavior During Therapy: WFL for tasks assessed/performed Overall Cognitive Status: Within Functional Limits for tasks assessed        General Comments      Exercises     Assessment/Plan    PT Assessment Patient needs continued PT services  PT Problem List Decreased balance;Decreased activity tolerance;Decreased mobility       PT Treatment Interventions DME instruction;Therapeutic exercise;Gait training;Balance training;Neuromuscular re-education;Patient/family education;Therapeutic activities    PT Goals (Current goals can be found in the Care Plan section)  Acute Rehab PT Goals Patient Stated Goal: Go home PT Goal Formulation: With patient/family Time For Goal Achievement: 03/05/21 Potential to Achieve Goals: Good     Frequency Min 3X/week        AM-PAC PT "6 Clicks" Mobility  Outcome Measure Help needed turning from your back to your side while in a flat bed without using bedrails?: None Help needed moving from lying on your back to sitting on the side of a flat bed without using bedrails?: None Help needed moving to and from a bed to a chair (including a wheelchair)?: A Little Help needed standing up from a chair using your arms (e.g., wheelchair or bedside chair)?: A Little Help needed to walk in hospital room?: A Little Help needed climbing 3-5 steps with a railing? : A Little 6 Click Score: 20    End of Session Equipment Utilized During Treatment: Gait belt Activity Tolerance: Patient tolerated treatment well;Patient limited by fatigue Patient left: in bed;with call bell/phone within reach;with bed alarm set;with family/visitor present Nurse Communication: Mobility status PT Visit Diagnosis: Unsteadiness on feet (R26.81);Difficulty in walking, not elsewhere classified (R26.2)    Time: 7588-3254 PT Time Calculation (min) (ACUTE ONLY): 30 min   Charges:   PT Evaluation $PT Eval Low Complexity: 1 Low PT Treatments $Therapeutic Activity: 8-22 mins        Katina Dung. Hartnett-Rands, MS, PT [facility] [place] [phone]  Kyle Mccreedy  Molina 02/19/2021, 11:31 AM

## 2021-02-19 NOTE — Progress Notes (Addendum)
Progress Note    Kyle Molina  ZOX:[id number] DOB: 09/09/1940  DOA: 02/17/2021 PCP: Richmond CampbellKaplan, Kristen W., PA-C      Brief Narrative:    Medical records reviewed and are as summarized below:  Kyle RyderFranklin Frerking is a 81 y.o. male with medical history significant for diabetes mellitus, BPH, anxiety, hypertension, thrombocytopenia, overactive bladder, hyperbilirubinemia, who presented to the hospital because of generalized weakness and fever.  Reportedly, when EMS picked him up, his temperature was 103 F.  Patient said he has been feeling weak for the past few days.  He said he was unsteady on his feet and he was staggering at home.  He felt lightheaded.  He did not fall or lose consciousness.  No cough, rhinorrhea, sneezing, chest pain, shortness of breath, vomiting, diarrhea, abdominal pain or any urinary symptoms.  Of note, he was seen in the emergency room on 02/07/2021 for nausea and vomiting.      Assessment/Plan:   Principal Problem:   Sepsis (HCC) Active Problems:   Fever    Body mass index is 28.19 kg/m.   Fever, sepsis likely from acute UTI:  IV cefepime will be changed to IV ceftriaxone.  No growth on blood cultures thus far.  Urine culture report is pending  Generalized weakness: Awaiting PT and OT evaluation  Hypokalemia and hypophosphatemia: Improved.  Continue potassium repletion.  Diffuse maculopapular rash: Etiology is not clear.  No itching  Elevated liver enzymes: Improved   Anxiety: Continue diazepam  Type II DM: Hold metformin  Diet Order             Diet Carb Modified Fluid consistency: Thin; Room service appropriate? Yes  Diet effective now                      Consultants: None  Procedures: None    Medications:    aspirin EC  81 mg Oral Daily   atorvastatin  20 mg Oral QHS   diazepam  2.5 mg Oral BID   doxazosin  8 mg Oral Q supper   enoxaparin (LOVENOX) injection  40 mg Subcutaneous Q24H   insulin aspart  0-9  Units Subcutaneous TID WC   potassium chloride  40 mEq Oral Once   Continuous Infusions:  cefTRIAXone (ROCEPHIN)  IV        Anti-infectives (From admission, onward)    Start     Dose/Rate Route Frequency Ordered Stop   02/19/21 1200  cefTRIAXone (ROCEPHIN) 2 g in sodium chloride 0.9 % 100 mL IVPB        2 g 200 mL/hr over 30 Minutes Intravenous Every 24 hours 02/19/21 1101     02/18/21 1300  vancomycin (VANCOCIN) IVPB 1000 mg/200 mL premix  Status:  Discontinued        1,000 mg 200 mL/hr over 60 Minutes Intravenous Every 24 hours 02/17/21 1206 02/18/21 1402   02/18/21 0000  ceFEPIme (MAXIPIME) 2 g in sodium chloride 0.9 % 100 mL IVPB  Status:  Discontinued        2 g 200 mL/hr over 30 Minutes Intravenous Every 12 hours 02/17/21 1203 02/19/21 1059   02/17/21 2200  metroNIDAZOLE (FLAGYL) IVPB 500 mg  Status:  Discontinued        500 mg 100 mL/hr over 60 Minutes Intravenous Every 12 hours 02/17/21 1432 02/18/21 1402   02/17/21 1530  ceFEPIme (MAXIPIME) 2 g in sodium chloride 0.9 % 100 mL IVPB  Status:  Discontinued  2 g 200 mL/hr over 30 Minutes Intravenous  Once 02/17/21 1432 02/17/21 1434   02/17/21 1530  vancomycin (VANCOCIN) IVPB 1000 mg/200 mL premix  Status:  Discontinued        1,000 mg 200 mL/hr over 60 Minutes Intravenous  Once 02/17/21 1432 02/17/21 1434   02/17/21 1200  ceFEPIme (MAXIPIME) 2 g in sodium chloride 0.9 % 100 mL IVPB        2 g 200 mL/hr over 30 Minutes Intravenous  Once 02/17/21 1152 02/17/21 1251   02/17/21 1200  metroNIDAZOLE (FLAGYL) IVPB 500 mg        500 mg 100 mL/hr over 60 Minutes Intravenous  Once 02/17/21 1152 02/17/21 1340   02/17/21 1200  vancomycin (VANCOCIN) IVPB 1000 mg/200 mL premix  Status:  Discontinued        1,000 mg 200 mL/hr over 60 Minutes Intravenous  Once 02/17/21 1152 02/17/21 1155   02/17/21 1200  vancomycin (VANCOREADY) IVPB 1750 mg/350 mL        1,750 mg 175 mL/hr over 120 Minutes Intravenous  Once 02/17/21 1155 02/17/21  1528              Family Communication/Anticipated D/C date and plan/Code Status   DVT prophylaxis: enoxaparin (LOVENOX) injection 40 mg Start: 02/17/21 1530 SCDs Start: 02/17/21 1431     Code Status: Full Code  Family Communication: Wife at the bedside Disposition Plan: Possible discharge to home tomorrow   Status is: Inpatient  Remains inpatient appropriate because: IV antibiotics           Subjective:   He has no complaints.  He feels much better today.  His wife was at the bedside.  Objective:    Vitals:   02/18/21 1242 02/18/21 2017 02/18/21 2300 02/19/21 0541  BP: (!) 125/53 (!) 101/58  (!) 114/58  Pulse: 81 68  (!) 54  Resp: 17 18  18   Temp: 98.6 F (37 C) 100.3 F (37.9 C) 98.2 F (36.8 C) 98.3 F (36.8 C)  TempSrc: Oral  Oral   SpO2: 99% 98%  97%  Weight:      Height:       No data found.   Intake/Output Summary (Last 24 hours) at 02/19/2021 1105 Last data filed at 02/19/2021 0900 Gross per 24 hour  Intake 1475.18 ml  Output 2100 ml  Net -624.82 ml   Filed Weights   02/17/21 1039  Weight: 81.6 kg    Exam:  GEN: NAD SKIN: Diffuse maculopapular rash on the trunk and extremities appear to be improving EYES: No pallor or icterus ENT: MMM CV: RRR PULM: CTA B ABD: soft, ND, NT, +BS CNS: AAO x 3, non focal EXT: No edema or tenderness           Data Reviewed:   I have personally reviewed following labs and imaging studies:  Labs: Labs show the following:   Basic Metabolic Panel: Recent Labs  Lab 02/17/21 1102 02/17/21 1103 02/17/21 1103 02/18/21 0522 02/19/21 0504  NA  --  137  --  142 144  K  --  3.4*   < > 3.4* 3.5  CL  --  106  --  110 112*  CO2  --  21*  --  24 25  GLUCOSE  --  158*  --  136* 132*  BUN  --  9  --  9 11  CREATININE  --  1.24  --  1.05   1.03 1.03  CALCIUM  --  8.1*  --  8.2* 8.1*  MG 1.7  --   --   --  2.0  PHOS 1.3*  --   --   --  3.0   < > = values in this interval not displayed.    GFR Estimated Creatinine Clearance: 56.5 mL/min (by C-G formula based on SCr of 1.03 mg/dL). Liver Function Tests: Recent Labs  Lab 02/17/21 1103  AST 41  ALT 48*  ALKPHOS 118  BILITOT 2.7*  PROT 5.8*  ALBUMIN 2.9*   No results for input(s): LIPASE, AMYLASE in the last 168 hours. No results for input(s): AMMONIA in the last 168 hours. Coagulation profile Recent Labs  Lab 02/17/21 1103 02/18/21 0522  INR 1.0 1.2    CBC: Recent Labs  Lab 02/17/21 1103 02/19/21 0504  WBC 11.2* 7.0  NEUTROABS 10.1* 5.3  HGB 11.7* 10.1*  HCT 35.2* 31.4*  MCV 88.9 89.7  PLT 187 164   Cardiac Enzymes: No results for input(s): CKTOTAL, CKMB, CKMBINDEX, TROPONINI in the last 168 hours. BNP (last 3 results) No results for input(s): PROBNP in the last 8760 hours. CBG: Recent Labs  Lab 02/18/21 0736 02/18/21 1114 02/18/21 1653 02/18/21 2102 02/19/21 0726  GLUCAP 129* 147* 155* 176* 128*   D-Dimer: No results for input(s): DDIMER in the last 72 hours. Hgb A1c: Recent Labs    02/17/21 1103  HGBA1C 6.8*   Lipid Profile: No results for input(s): CHOL, HDL, LDLCALC, TRIG, CHOLHDL, LDLDIRECT in the last 72 hours. Thyroid function studies: No results for input(s): TSH, T4TOTAL, T3FREE, THYROIDAB in the last 72 hours.  Invalid input(s): FREET3 Anemia work up: Recent Labs    02/17/21 1102  VITAMINB12 4,600*   Sepsis Labs: Recent Labs  Lab 02/17/21 1103 02/17/21 1308 02/17/21 1736 02/18/21 0522 02/18/21 0807 02/19/21 0504  PROCALCITON  --   --   --  0.81  --   --   WBC 11.2*  --   --   --   --  7.0  LATICACIDVEN 2.6* 3.3* 3.0*  --  1.3  --     Microbiology Recent Results (from the past 240 hour(s))  Urine Culture     Status: Abnormal (Preliminary result)   Collection Time: 02/17/21 10:43 AM   Specimen: In/Out Cath Urine  Result Value Ref Range Status   Specimen Description   Final    IN/OUT CATH URINE Performed at [facility], [address], [place],  [place] [address]    Special Requests   Final    NONE Performed at [facility], [address] [place], [place] [address]    Culture (A)  Final    >=100,000 COLONIES/mL GRAM NEGATIVE RODS IDENTIFICATION AND SUSCEPTIBILITIES TO FOLLOW Performed at [facility], [address], [place], [place] [address]    Report Status PENDING  Incomplete  Resp Panel by RT-PCR (Flu A&B, Covid) Urine, Clean Catch     Status: None   Collection Time: 02/17/21 10:50 AM   Specimen: Urine, Clean Catch; Nasopharyngeal(NP) swabs in vial transport medium  Result Value Ref Range Status   SARS Coronavirus 2 by RT PCR NEGATIVE NEGATIVE Final    Comment: (NOTE) SARS-CoV-2 target nucleic acids are NOT DETECTED.  The SARS-CoV-2 RNA is generally detectable in upper respiratory specimens during the acute phase of infection. The lowest concentration of SARS-CoV-2 viral copies this assay can detect is 138 copies/mL. A negative result does not preclude SARS-Cov-2 infection and should not be used as the sole basis  for treatment or other patient management decisions. A negative result may occur with  improper specimen collection/handling, submission of specimen other than nasopharyngeal swab, presence of viral mutation(s) within the areas targeted by this assay, and inadequate number of viral copies(<138 copies/mL). A negative result must be combined with clinical observations, patient history, and epidemiological information. The expected result is Negative.  Fact Sheet for Patients:  BloggerCourse.com  Fact Sheet for Healthcare Providers:  SeriousBroker.it  This test is no t yet approved or cleared by the [place] FDA and  has been authorized for detection and/or diagnosis of SARS-CoV-2 by FDA under an Emergency Use Authorization (EUA). This EUA will remain  in effect (meaning this test can be used) for the duration of the COVID-19 declaration under Section  564(b)(1) of the Act, 21 U.S.C.section 360bbb-3(b)(1), unless the authorization is terminated  or revoked sooner.       Influenza A by PCR NEGATIVE NEGATIVE Final   Influenza B by PCR NEGATIVE NEGATIVE Final    Comment: (NOTE) The Xpert Xpress SARS-CoV-2/FLU/RSV plus assay is intended as an aid in the diagnosis of influenza from Nasopharyngeal swab specimens and should not be used as a sole basis for treatment. Nasal washings and aspirates are unacceptable for Xpert Xpress SARS-CoV-2/FLU/RSV testing.  Fact Sheet for Patients: BloggerCourse.com  Fact Sheet for Healthcare Providers: SeriousBroker.it  This test is not yet approved or cleared by the [place] FDA and has been authorized for detection and/or diagnosis of SARS-CoV-2 by FDA under an Emergency Use Authorization (EUA). This EUA will remain in effect (meaning this test can be used) for the duration of the COVID-19 declaration under Section 564(b)(1) of the Act, 21 U.S.C. section 360bbb-3(b)(1), unless the authorization is terminated or revoked.  Performed at [facility], [address], [place], [place] [address]   Blood Culture (routine x 2)     Status: None (Preliminary result)   Collection Time: 02/17/21 11:03 AM   Specimen: BLOOD LEFT FOREARM  Result Value Ref Range Status   Specimen Description BLOOD LEFT FOREARM  Final   Special Requests   Final    BOTTLES DRAWN AEROBIC AND ANAEROBIC Blood Culture adequate volume   Culture   Final    NO GROWTH 2 DAYS Performed at [facility], [address], [place], [place] [address]    Report Status PENDING  Incomplete  Blood Culture (routine x 2)     Status: None (Preliminary result)   Collection Time: 02/17/21 11:39 AM   Specimen: BLOOD LEFT HAND  Result Value Ref Range Status   Specimen Description BLOOD LEFT HAND  Final   Special Requests   Final    BOTTLES DRAWN AEROBIC AND ANAEROBIC Blood Culture results may not  be optimal due to an excessive volume of blood received in culture bottles   Culture   Final    NO GROWTH 2 DAYS Performed at [facility], [address], [place], [place] [address]    Report Status PENDING  Incomplete  MRSA Next Gen by PCR, Nasal     Status: None   Collection Time: 02/18/21  2:55 PM   Specimen: Nasal Mucosa; Nasal Swab  Result Value Ref Range Status   MRSA by PCR Next Gen NOT DETECTED NOT DETECTED Final    Comment: (NOTE) The GeneXpert MRSA Assay (FDA approved for NASAL specimens only), is one component of a comprehensive MRSA colonization surveillance program. It is not intended to diagnose MRSA infection nor to guide or monitor treatment for MRSA  infections. Test performance is not FDA approved in patients less than 66 years old. Performed at [facility], [address], [place], [place] [address]     Procedures and diagnostic studies:  DG Chest [facility] 1 View  Result Date: 02/17/2021 CLINICAL DATA:  Sepsis EXAM: PORTABLE CHEST 1 VIEW COMPARISON:  Chest x-ray 09/23/2019 FINDINGS: Cardiomediastinal silhouette is stable and within normal limits. Stable chronic appearing prominent interstitial lung markings with no focal consolidation identified. No pleural effusion or pneumothorax. IMPRESSION: No acute intrathoracic process identified Electronically Signed   By: Jannifer Hick M.D.   On: 02/17/2021 11:13               LOS: 2 days   Derrion Tritz  [facility]   Pager on [email or web address]. If 7PM-7AM, please contact night-coverage at [email or web address]     02/19/2021, 11:05 AM

## 2021-02-19 NOTE — Plan of Care (Signed)

## 2021-02-19 NOTE — Plan of Care (Signed)
°  Problem: Acute Rehab PT Goals(only PT should resolve) Goal: Patient Will Transfer Sit To/From Stand Outcome: Progressing Flowsheets (Taken 02/19/2021 1135) Patient will transfer sit to/from stand: with supervision Goal: Pt Will Transfer Bed To Chair/Chair To Bed Outcome: Progressing Flowsheets (Taken 02/19/2021 1135) Pt will Transfer Bed to Chair/Chair to Bed: with supervision Goal: Pt Will Ambulate Outcome: Progressing Flowsheets (Taken 02/19/2021 1135) Pt will Ambulate:  > 125 feet  with supervision  with cane Goal: Pt Will Go Up/Down Stairs Outcome: Progressing Flowsheets (Taken 02/19/2021 1135) Pt will Go Up / Down Stairs:  1-2 stairs  with cane Goal: Pt/caregiver will Perform Home Exercise Program Outcome: Progressing Flowsheets (Taken 02/19/2021 1135) Pt/caregiver will Perform Home Exercise Program:  For increased strengthening  For improved balance  With Supervision, verbal cues required/provided   Floria Raveling. Hartnett-Rands, MS, PT Per [place] [phone] 02/19/2021

## 2021-02-19 NOTE — TOC Progression Note (Signed)
Transition of Care [facility]) - Progression Note    Patient Details  Name: Kyle Molina MRN: [id number] Date of Birth: 1941-01-09  Transition of Care [facility]) CM/SW Wittmann, RN Phone Number: 02/19/2021, 10:29 AM  Clinical Narrative: During progression rounds patient may discharge on Sunday following PT evaluation. TOC to continue to track for assistance with discharge needs.          Expected Discharge Plan and Services                                                 Social Determinants of Health (SDOH) Interventions    Readmission Risk Interventions No flowsheet data found.

## 2021-02-20 DIAGNOSIS — N390 Urinary tract infection, site not specified: Secondary | ICD-10-CM | POA: Diagnosis present

## 2021-02-20 LAB — URINE CULTURE: Culture: >=100000 — AB

## 2021-02-20 LAB — GLUCOSE, CAPILLARY
Glucose-Capillary: 146 mg/dL — ABNORMAL HIGH (ref 70–99)
Glucose-Capillary: 162 mg/dL — ABNORMAL HIGH (ref 70–99)

## 2021-02-20 MED ORDER — AMOXICILLIN 500 MG PO TABS: 500.0000 mg | ORAL_TABLET | Freq: Three times a day (TID) | ORAL | 0 refills | Status: AC

## 2021-02-20 MED ORDER — SODIUM CHLORIDE 0.9 % IV SOLN
2.0000 g | Freq: Once | INTRAVENOUS | Status: AC
  Administered 2021-02-20: 2 g via INTRAVENOUS
  Filled 2021-02-20: qty 20

## 2021-02-20 NOTE — Discharge Summary (Addendum)
Physician Discharge Summary  Kyle Molina [id number] DOB: 06/18/1940 DOA: 02/17/2021  PCP: Aletha Halim., PA-C  Admit date: 02/17/2021 Discharge date: 02/20/2021  Discharge disposition: Home   Recommendations for Outpatient Follow-Up:   Follow-up with PCP in 1 to 2 weeks  Discharge Diagnosis:   Principal Problem:   Sepsis ([place]) Active Problems:   Fever   Acute lower UTI    Discharge Condition: Stable.  Diet recommendation:  Diet Order             Diet - low sodium heart healthy           Diet Carb Modified           Diet Carb Modified Fluid consistency: Thin; Room service appropriate? Yes  Diet effective now                     Code Status: Full Code     Hospital Course:   [name] is an 81 y.o. male with medical history significant for diabetes mellitus, BPH, anxiety, hypertension, thrombocytopenia, overactive bladder, hyperbilirubinemia, who presented to the hospital because of generalized weakness and fever.  Reportedly, when EMS picked him up, his temperature was 103 F. He said he was unsteady on his feet and he was staggering at home.  He felt lightheaded.  He did not fall or lose consciousness. Of note, he was seen in the emergency room on 02/07/2021 for nausea and vomiting.   He was found to have sepsis from acute urinary tract infection.  He also had diffuse maculopapular rash on the trunk and the upper and lower extremities.  He was treated with IV fluids and empiric IV antibiotics.  Urine culture showed E. coli that was pansensitive.  He had hypokalemia and hypophosphatemia that was successfully treated.  His rash is clearing up.  His condition has improved and is still stable for discharge to home today.  Discharge plan was discussed with the patient and his wife at the bedside.     Discharge Exam:    Vitals:   02/19/21 0541 02/19/21 1349 02/19/21 2116 02/20/21 0456  BP: (!) 114/58 (!) 135/58 132/61 114/64  Pulse: (!)  54 67 66 70  Resp: 18  18 18   Temp: 98.3 F (36.8 C) 98 F (36.7 C) 99.2 F (37.3 C) 98.6 F (37 C)  TempSrc:      SpO2: 97% 98% 97% 93%  Weight:      Height:         GEN: NAD SKIN: Rashes on the trunk and extremities are clearing up. EYES: EOMI ENT: MMM CV: RRR PULM: CTA B ABD: soft, ND, NT, +BS CNS: AAO x 3, non focal EXT: No edema or tenderness   The results of significant diagnostics from this hospitalization (including imaging, microbiology, ancillary and laboratory) are listed below for reference.     Procedures and Diagnostic Studies:   DG Chest Port 1 View  Result Date: 02/17/2021 CLINICAL DATA:  Sepsis EXAM: PORTABLE CHEST 1 VIEW COMPARISON:  Chest x-ray 09/23/2019 FINDINGS: Cardiomediastinal silhouette is stable and within normal limits. Stable chronic appearing prominent interstitial lung markings with no focal consolidation identified. No pleural effusion or pneumothorax. IMPRESSION: No acute intrathoracic process identified Electronically Signed   By: Ofilia Neas M.D.   On: 02/17/2021 11:13     Labs:   Basic Metabolic Panel: Recent Labs  Lab 02/17/21 1102 02/17/21 1103 02/17/21 1103 02/18/21 0522 02/19/21 0504  NA  --  137  --  142 144  K  --  3.4*   < > 3.4* 3.5  CL  --  106  --  110 112*  CO2  --  21*  --  24 25  GLUCOSE  --  158*  --  136* 132*  BUN  --  9  --  9 11  CREATININE  --  1.24  --  1.05   1.03 1.03  CALCIUM  --  8.1*  --  8.2* 8.1*  MG 1.7  --   --   --  2.0  PHOS 1.3*  --   --   --  3.0   < > = values in this interval not displayed.   GFR Estimated Creatinine Clearance: 56.5 mL/min (by C-G formula based on SCr of 1.03 mg/dL). Liver Function Tests: Recent Labs  Lab 02/17/21 1103  AST 41  ALT 48*  ALKPHOS 118  BILITOT 2.7*  PROT 5.8*  ALBUMIN 2.9*   No results for input(s): LIPASE, AMYLASE in the last 168 hours. No results for input(s): AMMONIA in the last 168 hours. Coagulation profile Recent Labs  Lab  02/17/21 1103 02/18/21 0522  INR 1.0 1.2    CBC: Recent Labs  Lab 02/17/21 1103 02/19/21 0504  WBC 11.2* 7.0  NEUTROABS 10.1* 5.3  HGB 11.7* 10.1*  HCT 35.2* 31.4*  MCV 88.9 89.7  PLT 187 164   Cardiac Enzymes: No results for input(s): CKTOTAL, CKMB, CKMBINDEX, TROPONINI in the last 168 hours. BNP: Invalid input(s): POCBNP CBG: Recent Labs  Lab 02/19/21 1128 02/19/21 1619 02/19/21 2115 02/20/21 0740 02/20/21 1106  GLUCAP 163* 139* 161* 146* 162*   D-Dimer No results for input(s): DDIMER in the last 72 hours. Hgb A1c No results for input(s): HGBA1C in the last 72 hours. Lipid Profile No results for input(s): CHOL, HDL, LDLCALC, TRIG, CHOLHDL, LDLDIRECT in the last 72 hours. Thyroid function studies No results for input(s): TSH, T4TOTAL, T3FREE, THYROIDAB in the last 72 hours.  Invalid input(s): FREET3 Anemia work up No results for input(s): VITAMINB12, FOLATE, FERRITIN, TIBC, IRON, RETICCTPCT in the last 72 hours. Microbiology Recent Results (from the past 240 hour(s))  Urine Culture     Status: Abnormal   Collection Time: 02/17/21 10:43 AM   Specimen: In/Out Cath Urine  Result Value Ref Range Status   Specimen Description   Final    IN/OUT CATH URINE Performed at [facility], [address] [place], [place] [address]    Special Requests   Final    NONE Performed at [facility], [address], [place], [place] [address]    Culture >=100,000 COLONIES/mL ESCHERICHIA COLI (A)  Final   Report Status 02/20/2021 FINAL  Final   Organism ID, Bacteria ESCHERICHIA COLI (A)  Final      Susceptibility   Escherichia coli - MIC*    AMPICILLIN <=2 SENSITIVE Sensitive     CEFAZOLIN <=4 SENSITIVE Sensitive     CEFEPIME <=0.12 SENSITIVE Sensitive     CEFTRIAXONE <=0.25 SENSITIVE Sensitive     CIPROFLOXACIN <=0.25 SENSITIVE Sensitive     GENTAMICIN <=1 SENSITIVE Sensitive     IMIPENEM <=0.25 SENSITIVE Sensitive     NITROFURANTOIN <=16 SENSITIVE Sensitive      TRIMETH/SULFA <=20 SENSITIVE Sensitive     AMPICILLIN/SULBACTAM <=2 SENSITIVE Sensitive     PIP/TAZO <=4 SENSITIVE Sensitive     * >=100,000 COLONIES/mL ESCHERICHIA COLI  Resp Panel by RT-PCR (Flu A&B, Covid) Urine, Clean Catch     Status: None  Collection Time: 02/17/21 10:50 AM   Specimen: Urine, Clean Catch; Nasopharyngeal(NP) swabs in vial transport medium  Result Value Ref Range Status   SARS Coronavirus 2 by RT PCR NEGATIVE NEGATIVE Final    Comment: (NOTE) SARS-CoV-2 target nucleic acids are NOT DETECTED.  The SARS-CoV-2 RNA is generally detectable in upper respiratory specimens during the acute phase of infection. The lowest concentration of SARS-CoV-2 viral copies this assay can detect is 138 copies/mL. A negative result does not preclude SARS-Cov-2 infection and should not be used as the sole basis for treatment or other patient management decisions. A negative result may occur with  improper specimen collection/handling, submission of specimen other than nasopharyngeal swab, presence of viral mutation(s) within the areas targeted by this assay, and inadequate number of viral copies(<138 copies/mL). A negative result must be combined with clinical observations, patient history, and epidemiological information. The expected result is Negative.  Fact Sheet for Patients:  EntrepreneurPulse.com.au  Fact Sheet for Healthcare Providers:  IncredibleEmployment.be  This test is no t yet approved or cleared by the [place] FDA and  has been authorized for detection and/or diagnosis of SARS-CoV-2 by FDA under an Emergency Use Authorization (EUA). This EUA will remain  in effect (meaning this test can be used) for the duration of the COVID-19 declaration under Section 564(b)(1) of the Act, 21 U.S.C.section 360bbb-3(b)(1), unless the authorization is terminated  or revoked sooner.       Influenza A by PCR NEGATIVE NEGATIVE Final    Influenza B by PCR NEGATIVE NEGATIVE Final    Comment: (NOTE) The Xpert Xpress SARS-CoV-2/FLU/RSV plus assay is intended as an aid in the diagnosis of influenza from Nasopharyngeal swab specimens and should not be used as a sole basis for treatment. Nasal washings and aspirates are unacceptable for Xpert Xpress SARS-CoV-2/FLU/RSV testing.  Fact Sheet for Patients: EntrepreneurPulse.com.au  Fact Sheet for Healthcare Providers: IncredibleEmployment.be  This test is not yet approved or cleared by the [place] FDA and has been authorized for detection and/or diagnosis of SARS-CoV-2 by FDA under an Emergency Use Authorization (EUA). This EUA will remain in effect (meaning this test can be used) for the duration of the COVID-19 declaration under Section 564(b)(1) of the Act, 21 U.S.C. section 360bbb-3(b)(1), unless the authorization is terminated or revoked.  Performed at [facility], [address] [place], [place] [address]   Blood Culture (routine x 2)     Status: None (Preliminary result)   Collection Time: 02/17/21 11:03 AM   Specimen: BLOOD LEFT FOREARM  Result Value Ref Range Status   Specimen Description BLOOD LEFT FOREARM  Final   Special Requests   Final    BOTTLES DRAWN AEROBIC AND ANAEROBIC Blood Culture adequate volume   Culture   Final    NO GROWTH 3 DAYS Performed at [facility], [address], [place], [place] [address]    Report Status PENDING  Incomplete  Blood Culture (routine x 2)     Status: None (Preliminary result)   Collection Time: 02/17/21 11:39 AM   Specimen: BLOOD LEFT HAND  Result Value Ref Range Status   Specimen Description BLOOD LEFT HAND  Final   Special Requests   Final    BOTTLES DRAWN AEROBIC AND ANAEROBIC Blood Culture results may not be optimal due to an excessive volume of blood received in culture bottles   Culture   Final    NO GROWTH 3 DAYS Performed at [facility], [address],  [place], [place] [address]  Report Status PENDING  Incomplete  MRSA Next Gen by PCR, Nasal     Status: None   Collection Time: 02/18/21  2:55 PM   Specimen: Nasal Mucosa; Nasal Swab  Result Value Ref Range Status   MRSA by PCR Next Gen NOT DETECTED NOT DETECTED Final    Comment: (NOTE) The GeneXpert MRSA Assay (FDA approved for NASAL specimens only), is one component of a comprehensive MRSA colonization surveillance program. It is not intended to diagnose MRSA infection nor to guide or monitor treatment for MRSA infections. Test performance is not FDA approved in patients less than 7 years old. Performed at [facility], [address] [place], [place] [address]      Discharge Instructions:   Discharge Instructions     Diet - low sodium heart healthy   Complete by: As directed    Diet Carb Modified   Complete by: As directed    Increase activity slowly   Complete by: As directed       Allergies as of 02/20/2021       Reactions   Sulfa Antibiotics Itching        Medication List     TAKE these medications    amoxicillin 500 MG tablet Commonly known as: AMOXIL Take 1 tablet (500 mg total) by mouth 3 (three) times daily for 3 days. Start taking on: February 21, 2021   aspirin 81 MG tablet Take 81 mg by mouth daily.   atorvastatin 20 MG tablet Commonly known as: LIPITOR Take 20 mg by mouth at bedtime.   diazepam 5 MG tablet Commonly known as: VALIUM Take 2.5 mg by mouth 2 (two) times daily.   doxazosin 8 MG tablet Commonly known as: CARDURA Take 8 mg by mouth daily with supper.   metFORMIN 500 MG 24 hr tablet Commonly known as: GLUCOPHAGE-XR Take 500 mg by mouth daily with supper.   traMADol 50 MG tablet Commonly known as: ULTRAM Take 50 mg by mouth in the morning and at bedtime.   vitamin B-12 1000 MCG tablet Commonly known as: CYANOCOBALAMIN Take 1 tablet (1,000 mcg total) by mouth daily.           If you experience worsening of your admission  symptoms, develop shortness of breath, life threatening emergency, suicidal or homicidal thoughts you must seek medical attention immediately by calling 911 or calling your MD immediately  if symptoms less severe.   You must read complete instructions/literature along with all the possible adverse reactions/side effects for all the medicines you take and that have been prescribed to you. Take any new medicines after you have completely understood and accept all the possible adverse reactions/side effects.    Please note   You were cared for by a hospitalist during your hospital stay. If you have any questions about your discharge medications or the care you received while you were in the hospital after you are discharged, you can call the unit and asked to speak with the hospitalist on call if the hospitalist that took care of you is not available. Once you are discharged, your primary care physician will handle any further medical issues. Please note that NO REFILLS for any discharge medications will be authorized once you are discharged, as it is imperative that you return to your primary care physician (or establish a relationship with a primary care physician if you do not have one) for your aftercare needs so that they can reassess your need for medications and monitor your lab  values.       Time coordinating discharge: 32 minutes  Signed:  [place]  [facility] 02/20/2021, 2:48 PM   Pager on [email or web address]. If 7PM-7AM, please contact night-coverage at [email or web address]

## 2021-02-20 NOTE — Plan of Care (Signed)

## 2021-02-20 NOTE — Progress Notes (Signed)
Went over discharge instructions with pt and pt's wife who is at bedside. No questions or concerns. Pt was taken downstairs to main stay via wheelchair by staff for discharge.

## 2021-02-22 LAB — CULTURE, BLOOD (ROUTINE X 2)
Culture: NO GROWTH
Culture: NO GROWTH
Special Requests: ADEQUATE

## 2022-02-19 IMAGING — CT CT ABD-PELV W/ CM
2 of 5 series · 16 of 46 positions shown, 18 images · IV contrast (omnipaque)
Comparison: None.

CLINICAL DATA: Nausea and vomiting with pain, initial encounter

EXAM:
CT ABDOMEN AND PELVIS WITH CONTRAST
TECHNIQUE: Multidetector CT imaging of the abdomen and pelvis was performed
using the standard protocol following bolus administration of
intravenous contrast.
CONTRAST:  100mL OMNIPAQUE IOHEXOL 300 MG/ML  SOLN

[Series 2: axial st · axial · 0.89mm/px · z∈[+1002,+1482]mm · 13 of 108 slices shown, 15 images]
[im 6/108  soft-tissue]
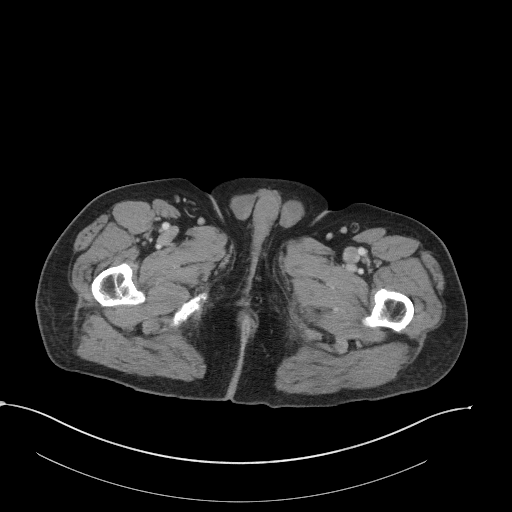
[im 6/108  bone]
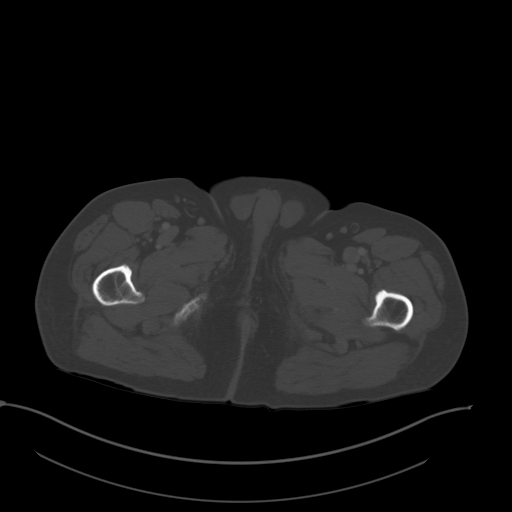
[im 17/108  soft-tissue]
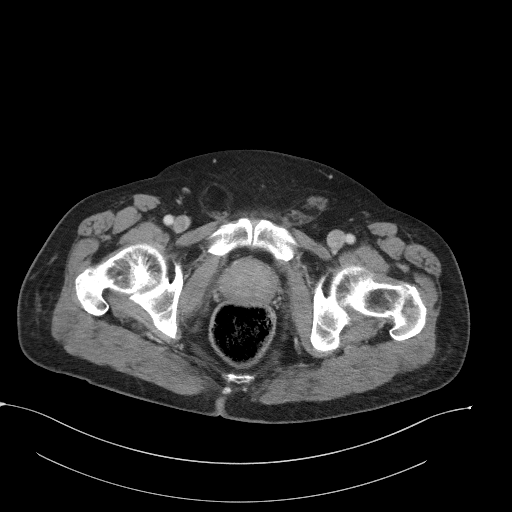
[im 23/108  soft-tissue]
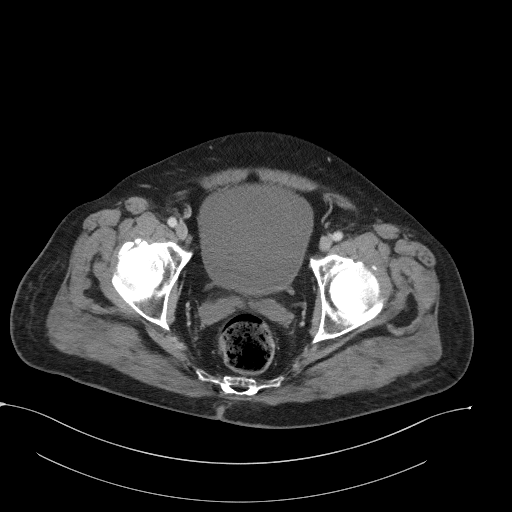
[im 29/108  soft-tissue]
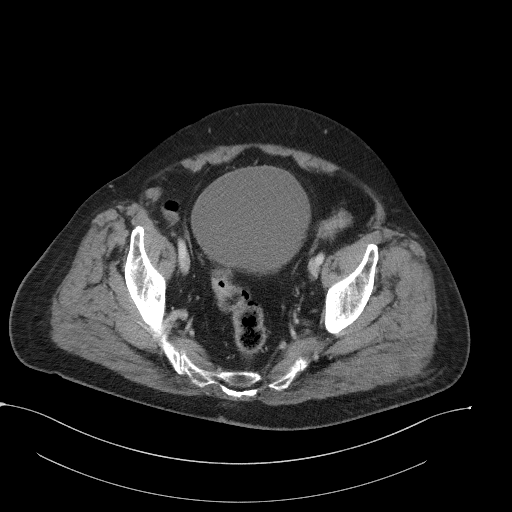
[im 40/108  soft-tissue]
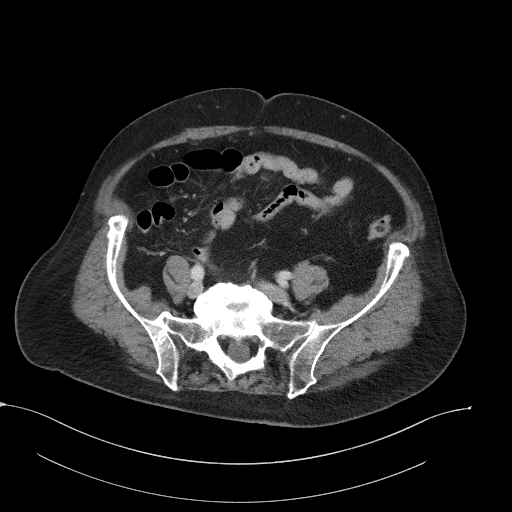
[im 46/108  soft-tissue]
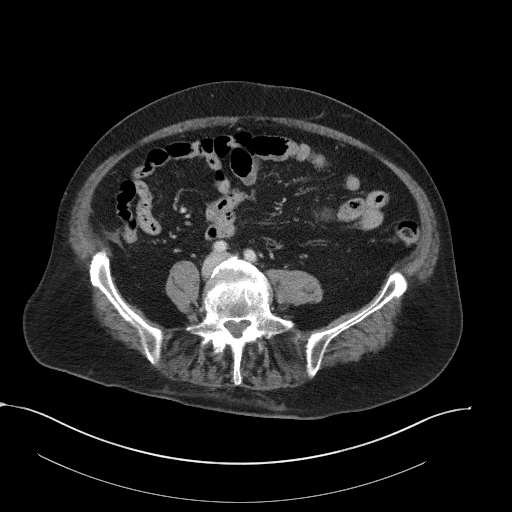
[im 57/108  soft-tissue]
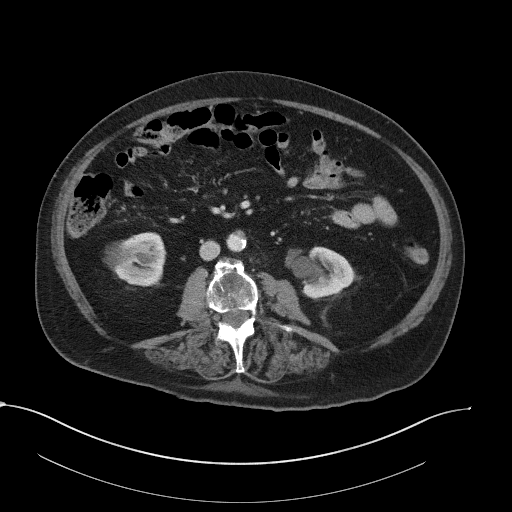
[im 62/108  soft-tissue]
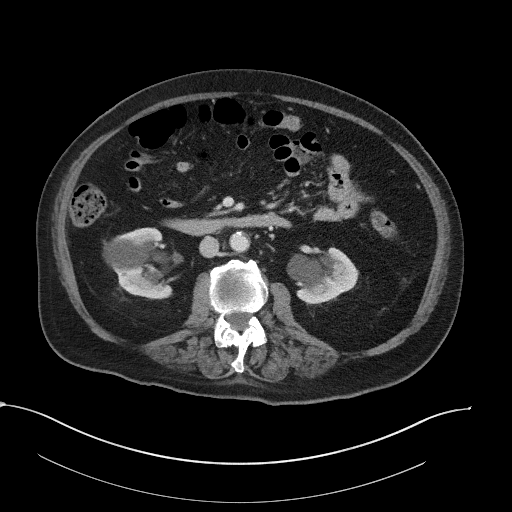
[im 68/108  soft-tissue]
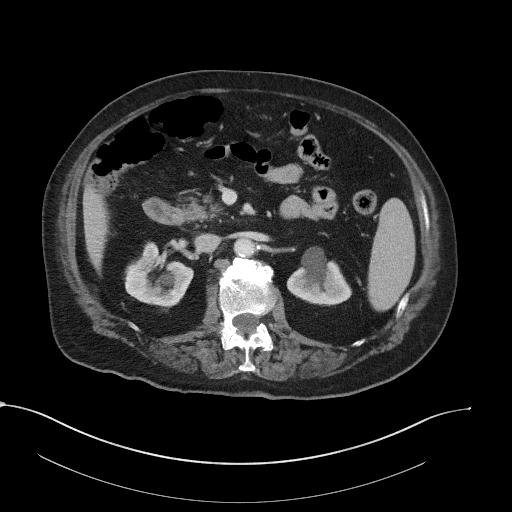
[im 68/108  bone]
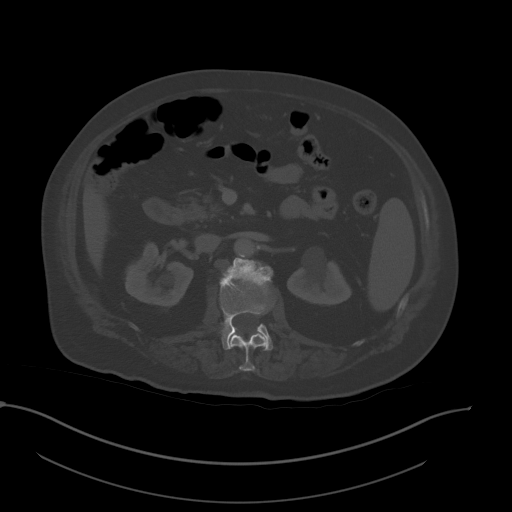
[im 79/108  soft-tissue]
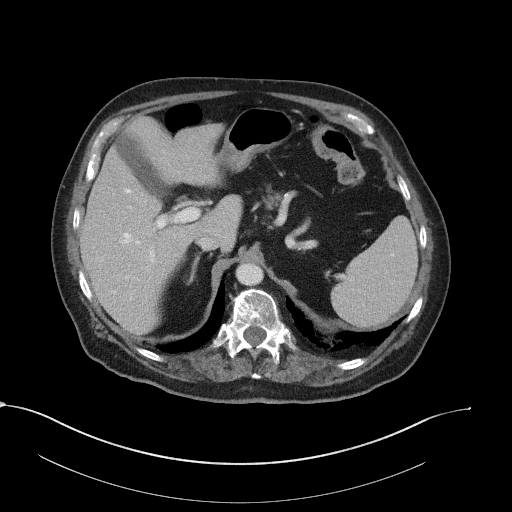
[im 85/108  soft-tissue]
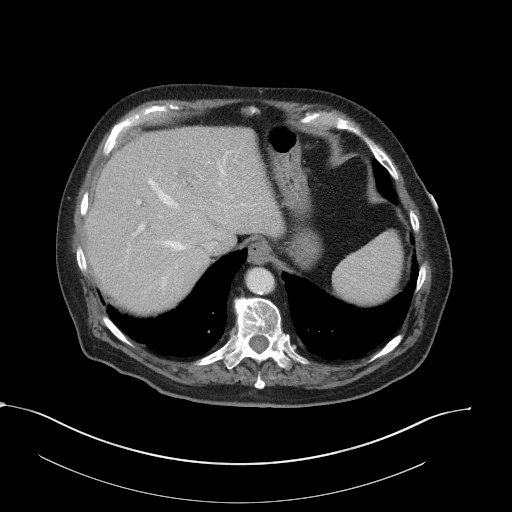
[im 91/108  soft-tissue]
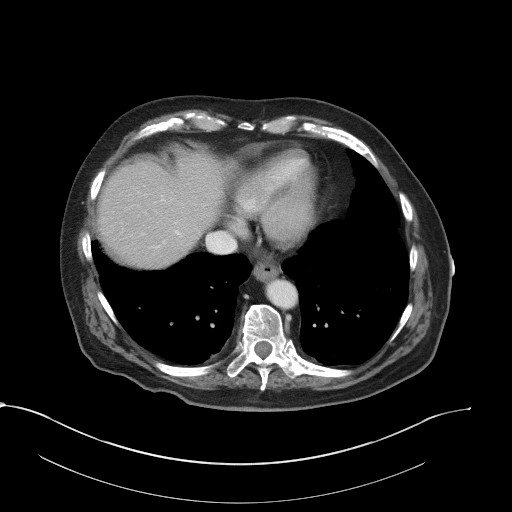
[im 102/108  soft-tissue]
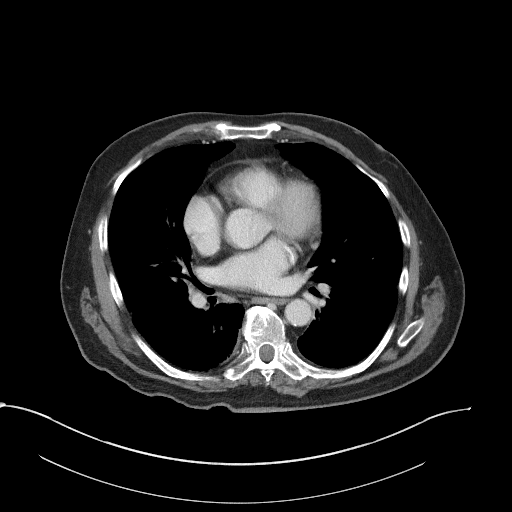

[Series 5: coronal st · coronal · 0.85mm/px · 3 of 110 slices shown]
[im 37/110  soft-tissue]
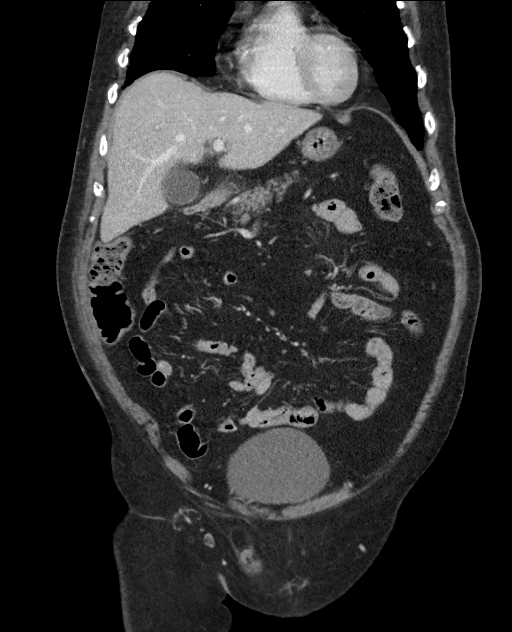
[im 49/110  soft-tissue]
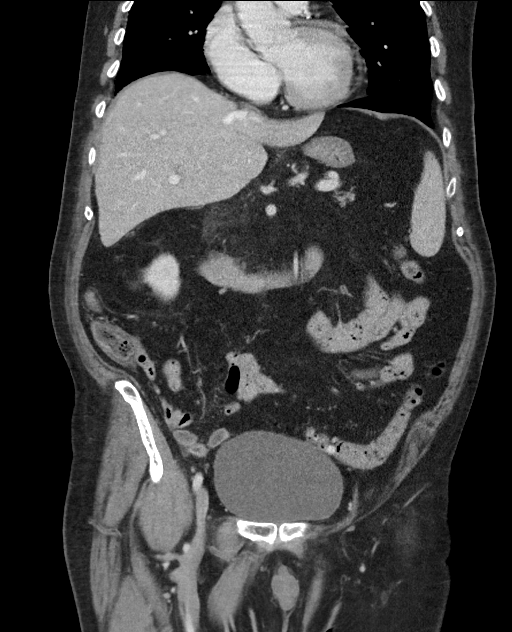
[im 61/110  soft-tissue]
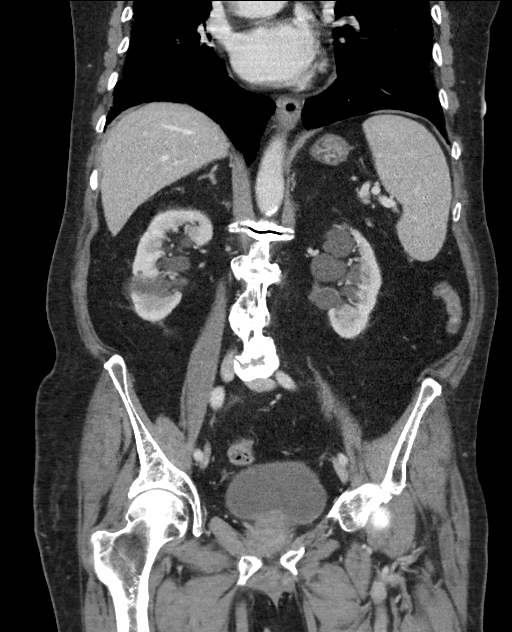

[16 of 46 positions shown; findings below may reference images not displayed]

FINDINGS: Lower chest: No acute abnormality.

Hepatobiliary: No focal liver abnormality is seen. No gallstones,
gallbladder wall thickening, or biliary dilatation.

Pancreas: Unremarkable. No pancreatic ductal dilatation or
surrounding inflammatory changes.

Spleen: Normal in size without focal abnormality.

Adrenals/Urinary Tract: Adrenal glands are within normal limits.
Kidneys demonstrate a normal enhancement pattern. Right renal cyst
is noted. Peripelvic cysts are noted bilaterally. No obstructive
changes are seen. The bladder is within normal limits.

Stomach/Bowel: Scattered diverticular changes noted without evidence
of diverticulitis. Appendix has been surgically removed. No
obstructive or inflammatory changes of the small bowel are seen.
Stomach is unremarkable.

Vascular/Lymphatic: Aortic atherosclerosis. No enlarged abdominal or
pelvic lymph nodes.

Reproductive: Prostate is unremarkable.

Other: No abdominal wall hernia or abnormality. No abdominopelvic
ascites.

Musculoskeletal: Degenerative changes of the lumbar spine are noted.
IMPRESSION: Renal cysts bilaterally.

Diverticulosis without diverticulitis.

No other focal abnormality is seen.

## 2022-03-01 IMAGING — DX DG CHEST 1V PORT
1 series · 1 of 1 positions shown · non-contrast
Comparison: Chest x-ray 09/23/2019

CLINICAL DATA: Sepsis

EXAM:
PORTABLE CHEST 1 VIEW

[chest ap]
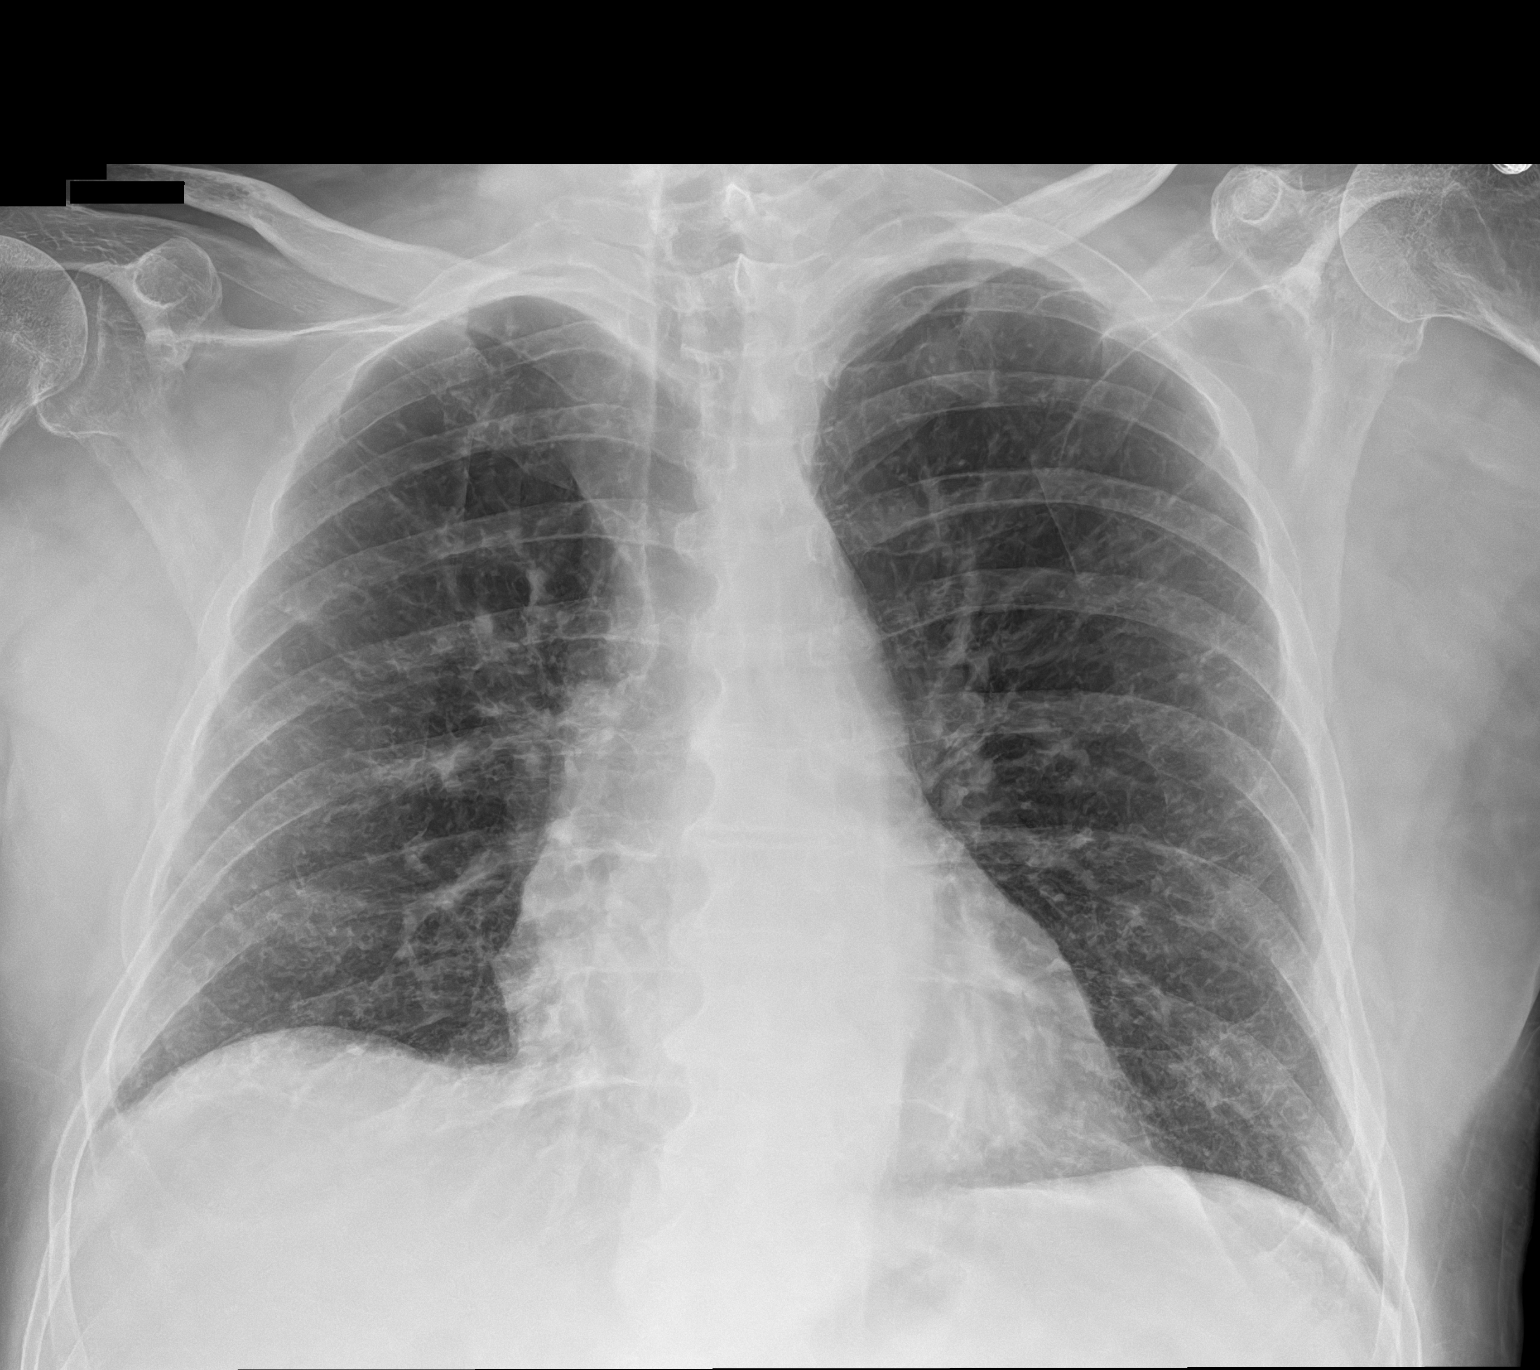

[1 of 1 positions shown; findings below may reference images not displayed]

FINDINGS: Cardiomediastinal silhouette is stable and within normal limits.
Stable chronic appearing prominent interstitial lung markings with
no focal consolidation identified. No pleural effusion or
pneumothorax.
IMPRESSION: No acute intrathoracic process identified

## 2022-06-05 ENCOUNTER — Other Ambulatory Visit: Payer: Self-pay | Admitting: Urology

## 2022-06-06 ENCOUNTER — Encounter (HOSPITAL_BASED_OUTPATIENT_CLINIC_OR_DEPARTMENT_OTHER): Payer: Self-pay | Admitting: Urology

## 2022-06-06 NOTE — Progress Notes (Signed)
Spoke w/ via phone for pre-op interview--- pt Lab needs dos----  [employer], ekg             Lab results------ no COVID test -----patient states asymptomatic no test needed Arrive at -------  1045 on 06-19-2022 NPO after MN NO Solid Food.  Clear liquids from MN until--- 0945 Med rec completed Medications to take morning of surgery ----- valium, doxazosin Diabetic medication ----- do not take metformin morning of surgery Patient instructed no nail polish to be worn day of surgery Patient instructed to bring photo id and insurance card day of surgery Patient aware to have Driver (ride ) / caregiver    for 24 hours after surgery -- wife, Kyle Molina Patient Special Instructions ----- n/a Pre-Op special Instructions ----- n/a Patient verbalized understanding of instructions that were given at this phone interview. Patient denies shortness of breath, chest pain, fever, cough at this phone interview.

## 2022-06-17 NOTE — Anesthesia Preprocedure Evaluation (Signed)
Anesthesia Evaluation  Patient identified by MRN, date of birth, ID band Patient awake    Reviewed: Allergy & Precautions, H&P , NPO status , Patient's Chart, lab work & pertinent test results  Airway Mallampati: II  TM Distance: >3 FB Neck ROM: Full    Dental no notable dental hx. (+) Edentulous Upper, Edentulous Lower   Pulmonary neg pulmonary ROS   Pulmonary exam normal breath sounds clear to auscultation       Cardiovascular hypertension, negative cardio ROS Normal cardiovascular exam Rhythm:Regular Rate:Normal     Neuro/Psych negative neurological ROS  negative psych ROS   GI/Hepatic negative GI ROS, Neg liver ROS,,,  Endo/Other  negative endocrine ROSdiabetes    Renal/GU Renal diseaseLab Results      Component                Value               Date                      CREATININE               1.10                06/19/2022                BUN                      14                  06/19/2022                NA                       141                 06/19/2022                K                        4.1                 06/19/2022                CL                       104                 06/19/2022                  negative genitourinary   Musculoskeletal negative musculoskeletal ROS (+) Arthritis ,    Abdominal   Peds negative pediatric ROS (+)  Hematology negative hematology ROS (+) Lab Results      Component                Value               Date                             HGB                      15.3                06/19/2022  HCT                      45.0                06/19/2022                 Anesthesia Other Findings   Reproductive/Obstetrics negative OB ROS                             Anesthesia Physical Anesthesia Plan  ASA: 3  Anesthesia Plan: General   Post-op Pain Management: Ofirmev IV (intra-op)* and Precedex   Induction:   PONV  Risk Score and Plan: 3 and Treatment may vary due to age or medical condition and Ondansetron  Airway Management Planned: LMA  Additional Equipment: None  Intra-op Plan:   Post-operative Plan:   Informed Consent: I have reviewed the patients History and Physical, chart, labs and discussed the procedure including the risks, benefits and alternatives for the proposed anesthesia with the patient or authorized representative who has indicated his/her understanding and acceptance.     Dental advisory given  Plan Discussed with:   Anesthesia Plan Comments:         Anesthesia Quick Evaluation

## 2022-06-19 ENCOUNTER — Ambulatory Visit (HOSPITAL_BASED_OUTPATIENT_CLINIC_OR_DEPARTMENT_OTHER): Payer: Medicare HMO | Admitting: Anesthesiology

## 2022-06-19 ENCOUNTER — Encounter (HOSPITAL_BASED_OUTPATIENT_CLINIC_OR_DEPARTMENT_OTHER)
Admission: RE | Disposition: A | Discharge status: Home / Self Care | Payer: Self-pay | Source: Home / Self Care | Attending: Urology

## 2022-06-19 ENCOUNTER — Other Ambulatory Visit: Payer: Self-pay

## 2022-06-19 ENCOUNTER — Encounter (HOSPITAL_BASED_OUTPATIENT_CLINIC_OR_DEPARTMENT_OTHER): Payer: Self-pay | Admitting: Urology

## 2022-06-19 ENCOUNTER — Ambulatory Visit (HOSPITAL_BASED_OUTPATIENT_CLINIC_OR_DEPARTMENT_OTHER)
Admission: RE | Admit: 2022-06-19 | Discharge: 2022-06-19 | Disposition: A | Discharge status: Home / Self Care | Payer: Medicare HMO | Attending: Urology | Admitting: Urology

## 2022-06-19 DIAGNOSIS — N471 Phimosis: Secondary | ICD-10-CM | POA: Insufficient documentation

## 2022-06-19 DIAGNOSIS — R3129 Other microscopic hematuria: Secondary | ICD-10-CM | POA: Diagnosis not present

## 2022-06-19 DIAGNOSIS — R3912 Poor urinary stream: Secondary | ICD-10-CM | POA: Insufficient documentation

## 2022-06-19 DIAGNOSIS — N401 Enlarged prostate with lower urinary tract symptoms: Secondary | ICD-10-CM | POA: Diagnosis not present

## 2022-06-19 DIAGNOSIS — I10 Essential (primary) hypertension: Secondary | ICD-10-CM

## 2022-06-19 DIAGNOSIS — N320 Bladder-neck obstruction: Secondary | ICD-10-CM | POA: Insufficient documentation

## 2022-06-19 DIAGNOSIS — Z01818 Encounter for other preprocedural examination: Secondary | ICD-10-CM

## 2022-06-19 DIAGNOSIS — E119 Type 2 diabetes mellitus without complications: Secondary | ICD-10-CM

## 2022-06-19 HISTORY — DX: Phimosis: N47.1

## 2022-06-19 HISTORY — DX: Personal history of other infectious and parasitic diseases: Z86.19

## 2022-06-19 HISTORY — DX: Type 2 diabetes mellitus without complications: E11.9

## 2022-06-19 HISTORY — DX: Benign prostatic hyperplasia with lower urinary tract symptoms: N40.1

## 2022-06-19 HISTORY — PX: CIRCUMCISION: SHX1350

## 2022-06-19 HISTORY — DX: Unspecified osteoarthritis, unspecified site: M19.90

## 2022-06-19 HISTORY — DX: Deficiency of other specified B group vitamins: E53.8

## 2022-06-19 LAB — POCT I-STAT, CHEM 8
BUN: 14 mg/dL (ref 8–23)
Calcium, Ion: 1.19 mmol/L (ref 1.15–1.40)
Chloride: 104 mmol/L (ref 98–111)
Creatinine, Ser: 1.1 mg/dL (ref 0.61–1.24)
Glucose, Bld: 182 mg/dL — ABNORMAL HIGH (ref 70–99)
HCT: 45 % (ref 39.0–52.0)
Hemoglobin: 15.3 g/dL (ref 13.0–17.0)
Potassium: 4.1 mmol/L (ref 3.5–5.1)
Sodium: 141 mmol/L (ref 135–145)
TCO2: 24 mmol/L (ref 22–32)

## 2022-06-19 LAB — GLUCOSE, CAPILLARY
Glucose-Capillary: 166 mg/dL — ABNORMAL HIGH (ref 70–99)
Glucose-Capillary: 125 mg/dL — ABNORMAL HIGH (ref 70–99)

## 2022-06-19 SURGERY — CIRCUMCISION, ADULT
Anesthesia: General

## 2022-06-19 MED ORDER — LACTATED RINGERS IV SOLN: INTRAVENOUS | Status: DC

## 2022-06-19 MED ORDER — FENTANYL CITRATE (PF) 100 MCG/2ML IJ SOLN
INTRAMUSCULAR | Status: AC
  Filled 2022-06-19: qty 2

## 2022-06-19 MED ORDER — LIDOCAINE HCL (PF) 2 % IJ SOLN
INTRAMUSCULAR | Status: AC
  Filled 2022-06-19: qty 5

## 2022-06-19 MED ORDER — BUPIVACAINE HCL 0.5 % IJ SOLN
INTRAMUSCULAR | Status: DC | PRN
  Administered 2022-06-19: 20 mL

## 2022-06-19 MED ORDER — FENTANYL CITRATE (PF) 100 MCG/2ML IJ SOLN: 25.0000 ug | INTRAMUSCULAR | Status: DC | PRN

## 2022-06-19 MED ORDER — 0.9 % SODIUM CHLORIDE (POUR BTL) OPTIME
TOPICAL | Status: DC | PRN
  Administered 2022-06-19: 500 mL

## 2022-06-19 MED ORDER — CEFAZOLIN SODIUM-DEXTROSE 2-4 GM/100ML-% IV SOLN
2.0000 g | INTRAVENOUS | Status: AC
  Administered 2022-06-19: 2 g via INTRAVENOUS

## 2022-06-19 MED ORDER — ACETAMINOPHEN 10 MG/ML IV SOLN: 1000.0000 mg | Freq: Once | INTRAVENOUS | Status: DC | PRN

## 2022-06-19 MED ORDER — BACITRACIN ZINC 500 UNIT/GM EX OINT
TOPICAL_OINTMENT | CUTANEOUS | Status: DC | PRN
  Administered 2022-06-19: 1 via TOPICAL

## 2022-06-19 MED ORDER — ONDANSETRON HCL 4 MG/2ML IJ SOLN: 4.0000 mg | Freq: Once | INTRAMUSCULAR | Status: DC | PRN

## 2022-06-19 MED ORDER — DEXAMETHASONE SODIUM PHOSPHATE 10 MG/ML IJ SOLN
INTRAMUSCULAR | Status: AC
  Filled 2022-06-19: qty 1

## 2022-06-19 MED ORDER — LIDOCAINE 2% (20 MG/ML) 5 ML SYRINGE
INTRAMUSCULAR | Status: DC | PRN
  Administered 2022-06-19: 80 mg via INTRAVENOUS

## 2022-06-19 MED ORDER — ONDANSETRON HCL 4 MG/2ML IJ SOLN
INTRAMUSCULAR | Status: AC
  Filled 2022-06-19: qty 2

## 2022-06-19 MED ORDER — PROPOFOL 10 MG/ML IV BOLUS
INTRAVENOUS | Status: DC | PRN
  Administered 2022-06-19: 80 mg via INTRAVENOUS
  Administered 2022-06-19: 20 mg via INTRAVENOUS

## 2022-06-19 MED ORDER — FENTANYL CITRATE (PF) 100 MCG/2ML IJ SOLN
INTRAMUSCULAR | Status: DC | PRN
  Administered 2022-06-19: 40 ug via INTRAVENOUS

## 2022-06-19 MED ORDER — CEPHALEXIN 500 MG PO CAPS: 500.0000 mg | ORAL_CAPSULE | Freq: Two times a day (BID) | ORAL | 0 refills | Status: AC

## 2022-06-19 MED ORDER — DOCUSATE SODIUM 100 MG PO CAPS: 100.0000 mg | ORAL_CAPSULE | Freq: Every day | ORAL | 0 refills | Status: AC | PRN

## 2022-06-19 MED ORDER — PROPOFOL 10 MG/ML IV BOLUS
INTRAVENOUS | Status: AC
  Filled 2022-06-19: qty 20

## 2022-06-19 MED ORDER — CEFAZOLIN SODIUM-DEXTROSE 2-4 GM/100ML-% IV SOLN
INTRAVENOUS | Status: AC
  Filled 2022-06-19: qty 100

## 2022-06-19 MED ORDER — OXYCODONE-ACETAMINOPHEN 5-325 MG PO TABS: 1.0000 | ORAL_TABLET | ORAL | 0 refills | Status: AC | PRN

## 2022-06-19 MED ORDER — ONDANSETRON HCL 4 MG/2ML IJ SOLN
INTRAMUSCULAR | Status: DC | PRN
  Administered 2022-06-19: 4 mg via INTRAVENOUS

## 2022-06-19 SURGICAL SUPPLY — 38 items
BLADE SURG 15 STRL LF DISP TIS (BLADE) ×1 IMPLANT
BLADE SURG 15 STRL SS (BLADE) ×1
BNDG CMPR 75X21 PLY HI ABS (MISCELLANEOUS) ×1
BNDG COHESIVE 1X5 TAN STRL LF (GAUZE/BANDAGES/DRESSINGS) ×1 IMPLANT
BNDG GZE 12X3 1 PLY HI ABS (GAUZE/BANDAGES/DRESSINGS) ×1
BNDG STRETCH GAUZE 3IN X12FT (GAUZE/BANDAGES/DRESSINGS) IMPLANT
CORD BIPOLAR FORCEPS 12FT (ELECTRODE) ×1 IMPLANT
COVER BACK TABLE 60X90IN (DRAPES) ×1 IMPLANT
COVER MAYO STAND STRL (DRAPES) ×1 IMPLANT
DRAPE LAPAROTOMY 100X72 PEDS (DRAPES) ×1 IMPLANT
DRSG TEGADERM 4X4.75 (GAUZE/BANDAGES/DRESSINGS) IMPLANT
ELECT NDL TIP 2.8 STRL (NEEDLE) ×1 IMPLANT
ELECT NEEDLE TIP 2.8 STRL (NEEDLE) IMPLANT
ELECT REM PT RETURN 9FT ADLT (ELECTROSURGICAL) ×1
ELECTRODE REM PT RTRN 9FT ADLT (ELECTROSURGICAL) ×1 IMPLANT
GAUZE 4X4 16PLY ~~LOC~~+RFID DBL (SPONGE) ×1 IMPLANT
GAUZE STRETCH 2X75IN STRL (MISCELLANEOUS) ×1 IMPLANT
GAUZE XEROFORM 1X8 LF (GAUZE/BANDAGES/DRESSINGS) ×1 IMPLANT
GLOVE BIO SURGEON STRL SZ7 (GLOVE) ×1 IMPLANT
GLOVE BIOGEL PI IND STRL 7.5 (GLOVE) ×1 IMPLANT
GOWN STRL REUS W/TWL LRG LVL3 (GOWN DISPOSABLE) ×1 IMPLANT
KIT TURNOVER CYSTO (KITS) ×1 IMPLANT
NDL HYPO 21X1 ECLIPSE (NEEDLE) IMPLANT
NDL HYPO 25X1 1.5 SAFETY (NEEDLE) IMPLANT
NEEDLE HYPO 21X1 ECLIPSE (NEEDLE) ×1 IMPLANT
NEEDLE HYPO 25X1 1.5 SAFETY (NEEDLE) IMPLANT
NS IRRIG 500ML POUR BTL (IV SOLUTION) IMPLANT
PACK BASIN DAY SURGERY FS (CUSTOM PROCEDURE TRAY) ×1 IMPLANT
PENCIL SMOKE EVACUATOR (MISCELLANEOUS) ×1 IMPLANT
SLEEVE SCD COMPRESS KNEE MED (STOCKING) ×1 IMPLANT
SPIKE FLUID TRANSFER (MISCELLANEOUS) IMPLANT
SUT SILK 2 0 SH (SUTURE) IMPLANT
SUT VIC AB 4-0 SH 27 (SUTURE) ×2
SUT VIC AB 4-0 SH 27XANBCTRL (SUTURE) ×2 IMPLANT
SYR CONTROL 10ML LL (SYRINGE) IMPLANT
TOWEL OR 17X24 6PK STRL BLUE (TOWEL DISPOSABLE) ×1 IMPLANT
TRAY DSU PREP LF (CUSTOM PROCEDURE TRAY) ×1 IMPLANT
WATER STERILE IRR 500ML POUR (IV SOLUTION) IMPLANT

## 2022-06-19 NOTE — Transfer of Care (Signed)
Immediate Anesthesia Transfer of Care Note  Patient: Kyle Molina  Procedure(s) Performed: CIRCUMCISION ADULT, REALEASE OF PENIAL ADHESIONS  Patient Location: PACU  Anesthesia Type:General  Level of Consciousness: awake, alert , and oriented  Airway & Oxygen Therapy: Patient Spontanous Breathing and Patient connected to nasal cannula oxygen  Post-op Assessment: Report given to RN  Post vital signs: Reviewed and stable  Last Vitals:  Vitals Value Taken Time  BP 127/95 06/19/22 1311  Temp    Pulse 73 06/19/22 1313  Resp 11 06/19/22 1313  SpO2 100 % 06/19/22 1313  Vitals shown include unvalidated device data.  Last Pain:  Vitals:   06/19/22 1003  TempSrc: Oral  PainSc: 0-No pain      Patients Stated Pain Goal: 1 (06/19/22 1003)  Complications: No notable events documented.

## 2022-06-19 NOTE — Anesthesia Procedure Notes (Signed)
Procedure Name: LMA Insertion Date/Time: 06/19/2022 12:10 PM  Performed by: Briant Sites, CRNAPre-anesthesia Checklist: Patient identified, Emergency Drugs available, Suction available and Patient being monitored Patient Re-evaluated:Patient Re-evaluated prior to induction Oxygen Delivery Method: Circle system utilized Preoxygenation: Pre-oxygenation with 100% oxygen Induction Type: IV induction Ventilation: Mask ventilation without difficulty LMA: LMA inserted LMA Size: 4.0 Number of attempts: 1 Airway Equipment and Method: Bite block Placement Confirmation: positive ETCO2 Tube secured with: Tape Dental Injury: Teeth and Oropharynx as per pre-operative assessment

## 2022-06-19 NOTE — Anesthesia Postprocedure Evaluation (Signed)
Anesthesia Post Note  Patient: [name]  Procedure(s) Performed: CIRCUMCISION ADULT, REALEASE OF PENIAL ADHESIONS     Patient location during evaluation: PACU Anesthesia Type: General Level of consciousness: awake and alert Pain management: pain level controlled Vital Signs Assessment: post-procedure vital signs reviewed and stable Respiratory status: spontaneous breathing, nonlabored ventilation, respiratory function stable and patient connected to nasal cannula oxygen Cardiovascular status: blood pressure returned to baseline and stable Postop Assessment: no apparent nausea or vomiting Anesthetic complications: no  No notable events documented.  Last Vitals:  Vitals:   06/19/22 1340 06/19/22 1435  BP: (!) 145/78 (!) 158/73  Pulse: 68 69  Resp: 17 15  Temp:  (!) 36.4 C  SpO2: 98% 99%    Last Pain:  Vitals:   06/19/22 1435  TempSrc: Oral  PainSc: 0-No pain                 Trevor Iha

## 2022-06-19 NOTE — Discharge Instructions (Addendum)
Activity:  You are encouraged to ambulate frequently (about every hour during waking hours) to help prevent blood clots from forming in your legs or lungs.  However, you should not engage in any heavy lifting (> 10-15 lbs), strenuous activity, or straining.  Diet: You should advance your diet as instructed by your physician.  It will be normal to have some bloating, nausea, and abdominal discomfort intermittently.  Prescriptions:  You will be provided a prescription for pain medication to take as needed.  If your pain is not severe enough to require the prescription pain medication, you may take extra strength Tylenol instead which will have less side effects.  You should also take a prescribed stool softener to avoid straining with bowel movements as the prescription pain medication may constipate you.  Incisions: You may remove your dressing bandages 48 hours after surgery if not removed in the hospital.  You will either have some small staples or special tissue glue at each of the incision sites. Once the bandages are removed (if present), the incisions may stay open to air.  You may start showering (but not soaking or bathing in water) the 2nd day after surgery and the incisions simply need to be patted dry after the shower.  No additional care is needed.  What to call us about: You should call the office [phone]) if you develop fever > 101 or develop persistent vomiting. Activity:  You are encouraged to ambulate frequently (about every hour during waking hours) to help prevent blood clots from forming in your legs or lungs.  However, you should not engage in any heavy lifting (> 10-15 lbs), strenuous activity, or straining.   You have a bandage on your penis and this may be removed tomorrow.     Post Anesthesia Home Care Instructions  Activity: Get plenty of rest for the remainder of the day. A responsible individual must stay with you for 24 hours following the procedure.  For the next  24 hours, DO NOT: -Drive a car -Advertising copywriter -Drink alcoholic beverages -Take any medication unless instructed by your physician -Make any legal decisions or sign important papers.  Meals: Start with liquid foods such as gelatin or soup. Progress to regular foods as tolerated. Avoid greasy, spicy, heavy foods. If nausea and/or vomiting occur, drink only clear liquids until the nausea and/or vomiting subsides. Call your physician if vomiting continues.  Special Instructions/Symptoms: Your throat may feel dry or sore from the anesthesia or the breathing tube placed in your throat during surgery. If this causes discomfort, gargle with warm salt water. The discomfort should disappear within 24 hours.

## 2022-06-19 NOTE — H&P (Signed)
Office Visit Report     06/01/2022   --------------------------------------------------------------------------------   Kyle Molina  MRN: [id number]  DOB: [birth year], 82 year old Male  SSN:    PRIMARY CARE:  Mady Gemma, PA  PRIMARY CARE FAX:  [phone]  REFERRING:  Jannifer Hick, MD  PROVIDER:  Jettie Pagan, M.D.  LOCATION:  [facility] [phone]     --------------------------------------------------------------------------------   CC/HPI: Kyle Molina 82 year old male seen in follow-up with with BPH with LUTS, phimosis and balanitis.   #1. BPH with LUTS:  He previously followed with Dr. Lindley Magnus in [facility]. He does have a history of TURP many years ago. He underwent cystoscopy with Dr. 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 He was found to be improving on alpha-blocker and did not go back for a reresection or transurethral incision of the bladder neck contracture.  -He developed urinary retention in 03/2020. He was found to have pinpoint phimosis which was dilated and Foley catheter was placed with return of 420 mL. He passed a void trial in 04/07/2020.  -He returns in 04/2022 noting a weaker flow stream. He is no longer taking doxazosin as he had a fall with this medication and was told not to start an alpha-blocker.  -He notes a weak flow stream that is worsening over the last year.  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  -BPH evaluation 05/2022: Prostate volume by CT is 39 cc. UroCuff with obstructive flow with max flow rate 7.4 mL/second with max EMG value 32.8. Cystoscopy with bladder neck contracture and recurrence of anteriorly.   #2. Phimosis:  -He has recurrent phimosis and this was dilated today, 06/01/2022 In order to allow passage of the cystoscope.   #3. Microscopic hematuria:  -Negative evaluation in 05/2022.  -Urinalysis 05/2022 with microscopic hematuria. CT  hematuria protocol 05/25/2022 with prostatomegaly, no stones, filling defects, urinary tract dilation or suspicious or urothelial lesions.  -He had pinpoint phimosis. Cystoscopy with bladder neck stenosis.   Urinalysis today with evidence of microscopic hematuria. He has a very remote smoking history as a teen. He denies family history of urologic malignancy.   Patient currently denies fever, chills, sweats, nausea, vomiting, abdominal or flank pain, gross hematuria or dysuria.     ALLERGIES: Sulfa    MEDICATIONS: Aspirin 81 mg tablet,chewable  Atorvastatin Calcium 20 mg tablet  Diazepam 5 mg tablet  Doxazosin Mesylate 8 mg tablet  Metformin Er Osmotic 500 mg tablet, extended release 24 hr  Oxybutynin Chloride Er 10 mg tablet, extended release 24 hr  Tramadol Hcl 50 mg tablet  Vitamin B12     GU PSH: Complex cystometrogram, with voiding pressure studies, any technique - 05/29/2022 Complex Uroflow - 05/29/2022 Emg surf Electrd - 05/29/2022 Locm 300-399Mg /Ml Iodine,1Ml - 05/24/2022     NON-GU PSH: Appendectomy (open) - 1961 Visit Complexity (formerly GPC1X) - 05/02/2022     GU PMH: BPH w/LUTS - 05/29/2022, - 05/02/2022, - 04/25/2021, - 11/04/2020, - 2022, - 2022 Incomplete bladder emptying - 05/29/2022, - 05/02/2022, - 11/04/2020, - 2022 Urinary Frequency - 05/29/2022, - 05/02/2022, - 04/25/2021, - 2022, - 2022 Weak Urinary Stream - 05/29/2022, - 2022 Microscopic hematuria - 05/24/2022, - 05/02/2022 Phimosis - 05/02/2022, - 04/25/2021, - 11/04/2020, - 2022, - 2022 Balanitis - 11/04/2020 Urinary Retention - 2022    NON-GU PMH: Anxiety Arthritis Diabetes Type 2 Encounter for general adult medical examination without abnormal findings, Encounter for preventive health examination  Hypercholesterolemia Myocardial Infarction    FAMILY HISTORY: 1 Daughter - Daughter Acute CVA (cerebrovascular accident) - Father Arthritis - Grandmother, Sister, Mother Diabetes - Sister,  Mother Hypercholesterolemia - Sister, Mother Hypertension - Mother, Sister   SOCIAL HISTORY: Marital Status: Married Current Smoking Status: Patient does not smoke anymore. Smoked for 1 month.   Tobacco Use Assessment Completed: Used Tobacco in last 30 days? Does not drink anymore.  Drinks 2 caffeinated drinks per day. Patient's occupation is/was retired from [employer].    REVIEW OF SYSTEMS:    GU Review Male:   Patient denies frequent urination, hard to postpone urination, burning/ pain with urination, get up at night to urinate, leakage of urine, stream starts and stops, trouble starting your stream, have to strain to urinate , erection problems, and penile pain.  Gastrointestinal (Upper):   Patient denies nausea, vomiting, and indigestion/ heartburn.  Gastrointestinal (Lower):   Patient denies diarrhea and constipation.  Constitutional:   Patient denies fever, night sweats, weight loss, and fatigue.  Skin:   Patient denies skin rash/ lesion and itching.  Eyes:   Patient denies blurred vision and double vision.  Ears/ Nose/ Throat:   Patient denies sore throat and sinus problems.  Hematologic/Lymphatic:   Patient denies swollen glands and easy bruising.  Cardiovascular:   Patient denies leg swelling and chest pains.  Respiratory:   Patient denies cough and shortness of breath.  Endocrine:   Patient denies excessive thirst.  Musculoskeletal:   Patient denies back pain and joint pain.  Neurological:   Patient denies headaches and dizziness.  Psychologic:   Patient denies anxiety and depression.   VITAL SIGNS: None   GU PHYSICAL EXAMINATION:    Scrotum: No lesions. No edema. No cysts. No warts.  Epididymides: Right: no spermatocele, no masses, no cysts, no tenderness, no induration, no enlargement. Left: no spermatocele, no masses, no cysts, no tenderness, no induration, no enlargement.  Testes: No tenderness, no swelling, no enlargement left testes. No tenderness, no swelling, no  enlargement right testes. Normal location left testes. Normal location right testes. No mass, no cyst, no varicocele, no hydrocele left testes. No mass, no cyst, no varicocele, no hydrocele right testes.  Urethral Meatus: Normal size. No lesion, no wart, no discharge, no polyp. Normal location.  Penis: Recurrent tight phimosis, dilated in order to pass cystoscope   MULTI-SYSTEM PHYSICAL EXAMINATION:    Constitutional: Well-nourished. No physical deformities. Normally developed. Good grooming.  Respiratory: No labored breathing, no use of accessory muscles.   Cardiovascular: Normal temperature, normal extremity pulses, no swelling, no varicosities.  Gastrointestinal: No mass, no tenderness, no rigidity, non obese abdomen.     Complexity of Data:  Source Of History:  Patient, Medical Record Summary  Records Review:   Previous Doctor Records, Previous Patient Records  Urine Test Review:   Urinalysis   PROCEDURES:         Flexible Cystoscopy - 52000  Risks, benefits, and some of the potential complications of the procedure were discussed at length with the patient including infection, bleeding, voiding discomfort, urinary retention, fever, chills, sepsis, and others. All questions were answered. Informed consent was obtained. Antibiotic prophylaxis was given. Sterile technique and intraurethral analgesia were used.  Meatus:  Normal size. Normal location. Normal condition.  Urethra:  No strictures.  External Sphincter:  Normal.  Verumontanum:  Normal.  Prostate:  Non-obstructing. No hyperplasia.  Bladder Neck:  Moderate bladder neck contracture.  Ureteral Orifices:  Normal location. Normal size. Normal shape. Effluxed clear urine.  Bladder:  Moderate trabeculation. No tumors. Normal mucosa. No stones.      The lower urinary tract was carefully examined. The procedure was well-tolerated and without complications. Antibiotic instructions were given. Instructions were given to call the office  immediately for bloody urine, difficulty urinating, urinary retention, painful or frequent urination, fever, chills, nausea, vomiting or other illness. The patient stated that he understood these instructions and would comply with them.         Urinalysis w/Scope Dipstick Dipstick Cont'd Micro  Color: Yellow Bilirubin: Neg mg/dL WBC/hpf: Packed/hpf  Appearance: Cloudy Ketones: Neg mg/dL RBC/hpf: 3 - 11/BJY  Specific Gravity: 1.020 Blood: 1+ ery/uL Bacteria: Many (>50/hpf)  pH: 6.0 Protein: Trace mg/dL Cystals: NS (Not Seen)  Glucose: Neg mg/dL Urobilinogen: 0.2 mg/dL Casts: NS (Not Seen)    Nitrites: Neg Trichomonas: Not Present    Leukocyte Esterase: 3+ leu/uL Mucous: Not Present      Epithelial Cells: 0 - 5/hpf      Yeast: NS (Not Seen)      Sperm: Not Present    ASSESSMENT:      ICD-10 Details  1 GU:   Phimosis - N47.1   2   Weak Urinary Stream - R39.12   3   BPH w/LUTS - N40.1    PLAN:           Document Letter(s):  Created for Patient: Clinical Summary         Notes:   #1. BPH with LUTS:  -He had pinpoint phimosis as below. We are proceeding with circumcision as below. Reviewed cystoscopy with bladder neck contracture. Discussed that if he has persistent symptoms after circumcision, can consider TUIP or channel TURP.   2. Phimosis: He has recurrent tight phimosis. This was dilated today. I recommend circumcision. Discussed risk and benefits. SERD letter submitted.   #3. Microscopic hematuria: Negative evaluation. I recommend obtaining urinalysis annually and would repeat evaluation in 3-5 years if persistently positive.   I reviewed CT imaging with pancreatic lesion and gave report to give to his PCP. Recommendation is to repeat imaging in one year. Will defer to his PCP.   CC: Mady Gemma, PA   Urology Preoperative H&P   Chief Complaint: Phimosis  History of Present Illness: Kyle Molina is a 82 y.o. male with phimosis here for circumcision.    Past  Medical History:  Diagnosis Date   Anxiety    Arthritis    back   B12 deficiency    Benign localized prostatic hyperplasia with lower urinary tract symptoms (LUTS)    History of cellulitis 10/2016   left hand w/ sepsis   History of sepsis    secondary to UTI 08/ 2021;    02/2021   History of thrombocytopenia 2018   Hyperlipidemia    Hypertension    hx nulcear stress test in epic 01-10-2000, low risk , ef 59%   Overactive bladder    Phimosis    Type 2 diabetes mellitus (HCC)    followed by pcp    (06-06-2022  per pt check blood sugar every other day in am fasting, average 140s)    Past Surgical History:  Procedure Laterality Date   APPENDECTOMY     age 32   CATARACT EXTRACTION W/PHACO Left 12/02/2020   Procedure: CATARACT EXTRACTION PHACO AND INTRAOCULAR LENS PLACEMENT (IOC);  Surgeon: Fabio Pierce, MD;  Location: [facility];  Service: Ophthalmology;  Laterality: Left;  CDE 22.27   CATARACT EXTRACTION W/PHACO Right 12/27/2020   Procedure:  CATARACT EXTRACTION PHACO AND INTRAOCULAR LENS PLACEMENT RIGHT EYE;  Surgeon: Fabio Pierce, MD;  Location: [facility];  Service: Ophthalmology;  Laterality: Right;  right CDE=13.12   COLONOSCOPY  2010   TONSILLECTOMY     child    Allergies:  Allergies  Allergen Reactions   Iron Nausea Only   Sulfa Antibiotics Itching    History reviewed. No pertinent family history.  Social History:  reports that he has never smoked. He has never used smokeless tobacco. He reports that he does not drink alcohol and does not use drugs.  ROS: A complete review of systems was performed.  All systems are negative except for pertinent findings as noted.  Physical Exam:  Vital signs in last 24 hours: Temp:  [97.7 F (36.5 C)] 97.7 F (36.5 C) (05/06 1003) Pulse Rate:  [89] 89 (05/06 1003) Resp:  [18] 18 (05/06 1003) BP: (160)/(92) 160/92 (05/06 1003) SpO2:  [99 %] 99 % (05/06 1003) Weight:  [75.9 kg] 75.9 kg (05/06 1003) Constitutional:  Alert and  oriented, No acute distress Cardiovascular: Regular rate and rhythm Respiratory: Normal respiratory effort, Lungs clear bilaterally GI: Abdomen is soft, nontender, nondistended, no abdominal masses GU: No CVA tenderness Lymphatic: No lymphadenopathy Neurologic: Grossly intact, no focal deficits Psychiatric: Normal mood and affect  Laboratory Data:  Recent Labs    06/19/22 1020  HGB 15.3  HCT 45.0    Recent Labs    06/19/22 1020  NA 141  K 4.1  CL 104  GLUCOSE 182*  BUN 14  CREATININE 1.10     Results for orders placed or performed during the hospital encounter of 06/19/22 (from the past 24 hour(s))  Glucose, capillary     Status: Abnormal   Collection Time: 06/19/22  9:19 AM  Result Value Ref Range   Glucose-Capillary 166 (H) 70 - 99 mg/dL  I-STAT, chem 8     Status: Abnormal   Collection Time: 06/19/22 10:20 AM  Result Value Ref Range   Sodium 141 135 - 145 mmol/L   Potassium 4.1 3.5 - 5.1 mmol/L   Chloride 104 98 - 111 mmol/L   BUN 14 8 - 23 mg/dL   Creatinine, Ser 5.36 0.61 - 1.24 mg/dL   Glucose, Bld 644 (H) 70 - 99 mg/dL   Calcium, Ion 0.34 7.42 - 1.40 mmol/L   TCO2 24 22 - 32 mmol/L   Hemoglobin 15.3 13.0 - 17.0 g/dL   HCT 59.5 63.8 - 75.6 %   No results found for this or any previous visit (from the past 240 hour(s)).  Renal Function: Recent Labs    06/19/22 1020  CREATININE 1.10   Estimated Creatinine Clearance: 47.6 mL/min (by C-G formula based on SCr of 1.1 mg/dL).  Radiologic Imaging: No results found.  I independently reviewed the above imaging studies.  Assessment and Plan Kyle Molina is a 82 y.o. male with phimosis here for circumcision.     Matt R. Annelies Coyt MD 06/19/2022, 11:34 AM  [facility] Pager: [phone]

## 2022-06-19 NOTE — Op Note (Addendum)
Operative Note  Preoperative diagnosis:  1.  Phimosis  Postoperative diagnosis: 1.  Phimosis  Procedure(s): 1.  Circumcision 2. Dorsal penile nerve block 3. Release of penile adhesions  Surgeon: Jettie Pagan, MD  Assistants:  None  Anesthesia:  General  Complications:  None  EBL:  <72ml  Specimens: ID Type Source Tests Collected by Time Destination  1 : Foreskin Tissue PATH Soft tissue SURGICAL PATHOLOGY Jannifer Hick, MD 06/19/2022 1243     Drains/Catheters: 1.  none  Intraoperative findings:   Very tight phimosis Glans was inflamed with significant penile adhesions however no evidence of masses or suspicious lesions. Successful circumcision with meatus patent  Indication:  Kyle Molina is a 82 y.o. male who presented with phimosis and after being counseled with options he elected to have circumcision. He denies prior history of circumcision. He has not been able to see his glans in many years.  Description of procedure: The patient was brought back to the operating room, placed on table in a supine position.  He had bilateral sequential compression devices placed and received a dose of Ancef 2 gr preoperatively for antibiotic prophylaxis.  He was placed under general LM anesthesia and then standard prep was performed.  He was prepped and draped in standard sterile fashion.  Surgical time-out was performed identifying the correct patient, procedure, and all were in agreement.   A dorsal penile nerve block was given with .25% Marcaine plain, a total of 20mL used. Of note he had a very tight phimosis and only pinpoint opening in the distal foreskin such that the glans and meatus were not visualized. I started by performing a dorsal slit and carried this incision approximately 1.5 cm until I was able to release the glans. Of note, the penis had significant penile adhesions and these were taken down bluntly to expose the glans. The glans was inflamed and I could see evidence  of significant circumferential scarring. Penile adhesions were released. I did not see evidence of prior circumcision incision. A subcoronal incision made at 1 cm from sulcus. Hemostasis was obtained with bipolar. Hemostats were used to provide traction on the redundant prepuce and a dorsal slit was made with mets the incise the appropriate length of skin for removal. An anchoring interrupted 4-0 vicryl was placed reapproximate the skin, leaving the tail long to provide traction. Scissors were used to make a ventral slit and another vicryl was placed at this location. An incision was made on the skin between these two vicryls. The tissue was then removed and sent to pathology for examination. with cautery to control any blood vessels at the dartos fascia. Procedure was repeated on the other side, incising the skin and removing with cautery. Hemostasis was intact. Interrupted 4-0 vicryls were placed in interrupted fashion. Satisfied with the cosmesis, we finalized the case with the dressing; Xerform, covered with Bioclusive film. Of note, the meatus was widely patent with a hemostat.  Plan:  The patient was awoken from general anesthesia, transferred to the PACU in stable condition.  He will be recovered in the PACU and then discharged.  Matt R. Elishah Ashmore MD [facility]  Pager: [phone]

## 2022-06-20 LAB — SURGICAL PATHOLOGY

## 2022-06-21 ENCOUNTER — Encounter (HOSPITAL_BASED_OUTPATIENT_CLINIC_OR_DEPARTMENT_OTHER): Payer: Self-pay | Admitting: Urology
